# Patient Record
Sex: Female | Born: 1965 | Race: White | Hispanic: No | Smoking: Former smoker
Health system: Southern US, Community
[De-identification: ages and names within clinical notes are randomized; demographics above are authoritative.]

## PROBLEM LIST (undated history)

## (undated) DIAGNOSIS — K449 Diaphragmatic hernia without obstruction or gangrene: Secondary | ICD-10-CM

## (undated) DIAGNOSIS — N3010 Interstitial cystitis (chronic) without hematuria: Secondary | ICD-10-CM

## (undated) HISTORY — DX: Interstitial cystitis (chronic) without hematuria: N30.10

## (undated) HISTORY — PX: REDUCTION MAMMAPLASTY: SUR839

## (undated) HISTORY — DX: Diaphragmatic hernia without obstruction or gangrene: K44.9

---

## 1997-10-10 ENCOUNTER — Other Ambulatory Visit: Admission: RE | Admit: 1997-10-10 | Discharge: 1997-10-10 | Payer: Self-pay | Admitting: Obstetrics and Gynecology

## 2009-04-05 ENCOUNTER — Ambulatory Visit: Payer: Self-pay | Admitting: Family Medicine

## 2009-04-05 DIAGNOSIS — J209 Acute bronchitis, unspecified: Secondary | ICD-10-CM | POA: Insufficient documentation

## 2009-04-10 ENCOUNTER — Encounter: Payer: Self-pay | Admitting: Family Medicine

## 2009-04-15 ENCOUNTER — Telehealth (INDEPENDENT_AMBULATORY_CARE_PROVIDER_SITE_OTHER): Payer: Self-pay | Admitting: *Deleted

## 2010-02-12 NOTE — Progress Notes (Signed)
  Phone Note Outgoing Call Call back at [facility] Phone [phone]   Call placed by: Lajean Saver RN,  April 15, 2009  12:00 PM Call placed to: Patient Action Taken: Phone Call Completed Reason for Call: Discuss lab or test results Details for Reason: elevated pertusis panel Summary of Call: I spoke with Molly Boone regarding her bordetella pertusis lab results. I informed her Dr. 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 I told her I checked with Dr. Cathren Harsh, who is here today, reguarding treatment and he said the Z-pak she was given should treat the pertusis and if she is not imporoving to come back in to be seen by a doctor and given a new antibiotic Rx. She stated she was still coughing but it had much imporved and other symptoms had resolved. Initial call taken by: Lajean Saver RN,  April 15, 2009 4:02 PM

## 2010-02-12 NOTE — Assessment & Plan Note (Signed)
Summary: COUGH/TJ rm 3   Vital Signs:  Patient Profile:   45 Years Old Female CC:      Cold & URI symptoms Height:     66 inches Weight:      170 pounds O2 Sat:      100 % O2 treatment:    Room Air Temp:     98.7 degrees F oral Pulse rate:   107 / minute Pulse rhythm:   regular Resp:     18 per minute BP sitting:   122 / 86  (right arm) Cuff size:   regular  Vitals Entered By: Areta Haber CMA (April 05, 2009 10:02 AM)                  Prior Medication List:  No prior medications documented  Current Allergies (reviewed today): ! AUGMENTIN ! SULFA   History of Present Illness Chief Complaint: Cold & URI symptoms History of Present Illness: Patient has been coughing for 2 weeks it has been a productive cough and now she has been plagued by persistant unreleenting cough. She was DX w/pneumonia but no CXR was done. She states she was only given 2oz of tussionex, and doxycycline. She has run out of the tussionex and is still coughing wildly.   Current Problems: BRONCHITIS, ACUTE (ICD-466.0) COUGH (ICD-786.2)   Current Meds WELLBUTRIN XL 150 MG XR24H-TAB (BUPROPION HCL) 1 tab by mouth once daily NEXIUM 40 MG CPDR (ESOMEPRAZOLE MAGNESIUM) 1 tab by mouth once daily DOXYCYCLINE HYCLATE 100 MG SOLR (DOXYCYCLINE HYCLATE) 2 tabs by mouth once daily TUSSIONEX PENNKINETIC ER 8-10 MG/5ML LQCR (CHLORPHENIRAMINE-HYDROCODONE) as directed ZITHROMAX Z-PAK 250 MG  TABS (AZITHROMYCIN) Use as directed TUSSIONEX PENNKINETIC ER 8-10 MG/5ML LQCR (CHLORPHENIRAMINE-HYDROCODONE) sig 1 tsp by mouth twice a day as needed for cough  REVIEW OF SYSTEMS Constitutional Symptoms      Denies fever, chills, night sweats, weight loss, weight gain, and fatigue.  Eyes       Denies change in vision, eye pain, eye discharge, glasses, contact lenses, and eye surgery. Ear/Nose/Throat/Mouth       Complains of frequent runny nose, sinus problems, and hoarseness.      Denies hearing loss/aids, change in  hearing, ear pain, ear discharge, dizziness, frequent nose bleeds, sore throat, and tooth pain or bleeding.      Comments: green Respiratory       Complains of dry cough and productive cough.      Denies wheezing, shortness of breath, asthma, bronchitis, and emphysema/COPD.      Comments: chest congestion Cardiovascular       Denies murmurs, chest pain, and tires easily with exhertion.    Gastrointestinal       Denies stomach pain, nausea/vomiting, diarrhea, constipation, blood in bowel movements, and indigestion. Genitourniary       Denies painful urination, kidney stones, and loss of urinary control. Neurological       Complains of headaches.      Denies paralysis, seizures, and fainting/blackouts. Musculoskeletal       Denies muscle pain, joint pain, joint stiffness, decreased range of motion, redness, swelling, muscle weakness, and gout.  Skin       Denies bruising, unusual mles/lumps or sores, and hair/skin or nail changes.  Psych       Denies mood changes, temper/anger issues, anxiety/stress, speech problems, depression, and sleep problems. Other Comments: greenish-brown. Pt states she was Dx Pnuemonia Monday 03/31/09 and prescribed Doxycycline and Tussionex. Pt is out of cough medication.  Past History:  Social History: Last updated: 04/05/2009 Smokes Alcohol use-yes - occ - wknds Drug use-no  Risk Factors: Smoking Status: never (04/05/2009)  Social History: Reviewed history and no changes required. Smokes Alcohol use-yes - occ - wknds Drug use-no Smoking Status:  never Drug Use:  no Physical Exam General appearance: well developed, well nourished,  mild  distress Head: normocephalic, atraumatic Neck: neck supple,  trachea midline, no masses Chest/Lungs: no rales, wheezes, or rhonchi bilateral,patient actively coughing Heart: regular rate and  rhythm, no murmur Neurological: grossly intact and non-focal Skin: no obvious rashes or lesions MSE: oriented to time,  place, and person Assessment New Problems: BRONCHITIS, ACUTE (ICD-466.0) COUGH (ICD-786.2)  bronchitis cough  Patient Education: Patient and/or caregiver instructed in the following: rest fluids and Tylenol, quit smoking.  Plan New Medications/Changes: Sandria Senter ER 8-10 MG/5ML LQCR (CHLORPHENIRAMINE-HYDROCODONE) sig 1 tsp by mouth twice a day as needed for cough  #64floz x 0, 04/05/2009, Hassan Rowan MD ZITHROMAX Z-PAK 250 MG  TABS (AZITHROMYCIN) Use as directed  #1 x 0, 04/05/2009, Hassan Rowan MD  New Orders: T-Chest x-ray, 2 views [71020] Solumedrol up to 125mg  [J2930] Admin of Therapeutic Inj  intramuscular or subcutaneous [96372] New Patient Level III [04540] T- * Misc. Laboratory test [phone] Follow Up: Follow up in 2-3 days if no improvement, Follow up on an as needed basis, Follow up with Primary Physician  The patient and/or caregiver has been counseled thoroughly with regard to medications prescribed including dosage, schedule, interactions, rationale for use, and possible side effects and they verbalize understanding.  Diagnoses and expected course of recovery discussed and will return if not improved as expected or if the condition worsens. Patient and/or caregiver verbalized understanding.  Prescriptions: TUSSIONEX PENNKINETIC ER 8-10 MG/5ML LQCR (CHLORPHENIRAMINE-HYDROCODONE) sig 1 tsp by mouth twice a day as needed for cough  #22floz x 0   Entered and Authorized by:   Hassan Rowan MD   Signed by:   Hassan Rowan MD on 04/05/2009   Method used:   Print then Give to Patient   RxID:   [id number] ZITHROMAX Z-PAK 250 MG  TABS (AZITHROMYCIN) Use as directed  #1 x 0   Entered and Authorized by:   Hassan Rowan MD   Signed by:   Hassan Rowan MD on 04/05/2009   Method used:   Print then Give to Patient   RxID:   [id number]   Patient Instructions: 1)  Please schedule a follow-up appointment as needed in 3-5 days. 2)  Please schedule an appointment with your  primary doctor in 3-5 days. 3)  Tobacco is very bad for your health and your loved ones! You Should stop smoking!. 4)  Stop Smoking Tips: Choose a Quit date. Cut down before the Quit date. decide what you will do as a substitute when you feel the urge to smoke(gum,toothpick,exercise). 5)  Take your antibiotic as prescribed until ALL of it is gone, but stop if you develop a rash or swelling and contact our office as soon as possible. 6)  Acute bronchitis symptoms for less than 10 days are not helped by antibiotics. take over the counter cough medications. call if no improvment in  5-7 days, sooner if increasing cough, fever, or new symptoms( shortness of breath, chest pain). 7)  Medically cleared to return to work; return to work note provided.  Preventive Screening-Counseling & Management  Alcohol-Tobacco     Smoking Status: never     Smoking Cessation Counseling: yes  Drug Use:  no.      Medication Administration  Injection # 1:    Medication: Solumedrol up to 125mg     Diagnosis: BRONCHITIS, ACUTE (ICD-466.0)    Route: IM    Site: RUOQ gluteus    Exp Date: 09/31/2013    Lot #: [id number]    Mfr: Pharmacia    Comments: Administerd 125 mg    Patient tolerated injection without complications    Given by: Areta Haber CMA (April 05, 2009 10:57 AM)  Orders Added: 1)  T-Chest x-ray, 2 views [71020] 2)  Solumedrol up to 125mg  [J2930] 3)  Admin of Therapeutic Inj  intramuscular or subcutaneous [96372] 4)  New Patient Level III [99203] 5)  T- * Misc. Laboratory test [phone]

## 2011-03-21 IMAGING — CR DG CHEST 2V
3 series · 3 of 3 positions shown · non-contrast
Comparison: None.

CLINICAL DATA: Cough and chest tightness.

CHEST - 2 VIEW

[view not recorded (1 of 3)]
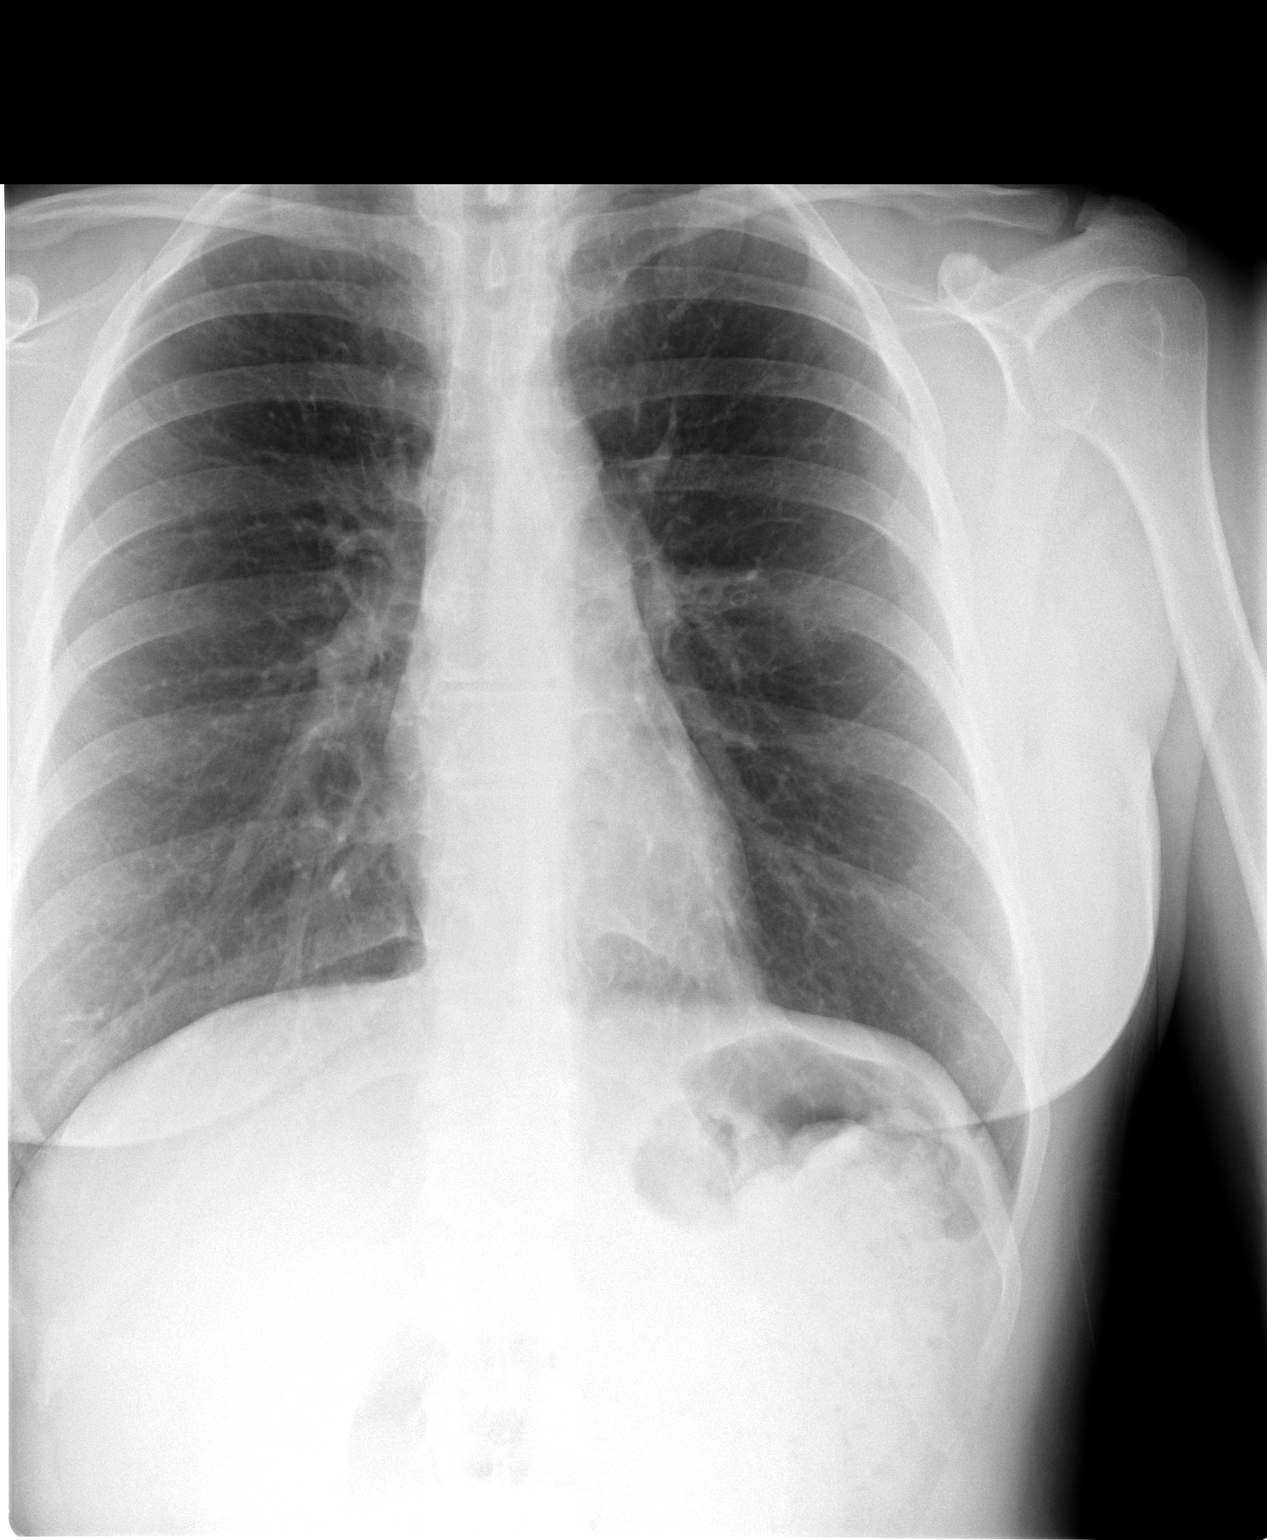

[view not recorded (2 of 3)]
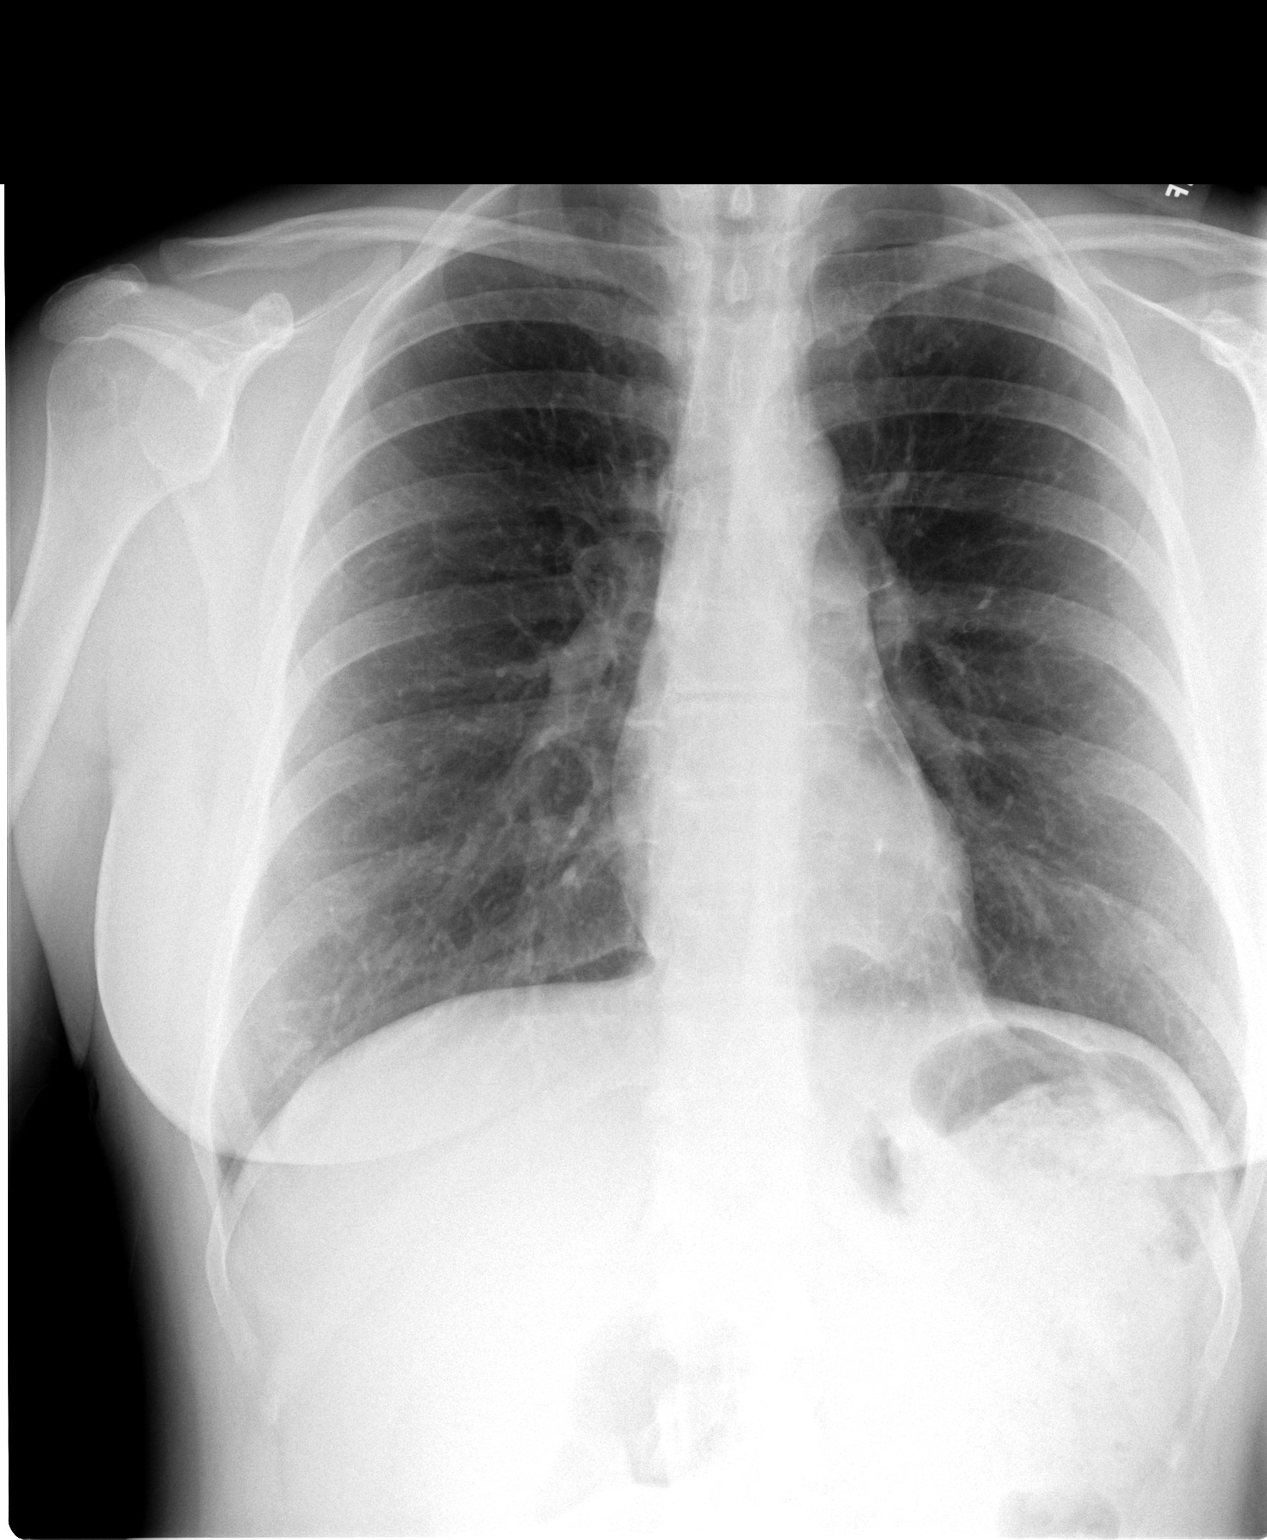

[view not recorded (3 of 3)]
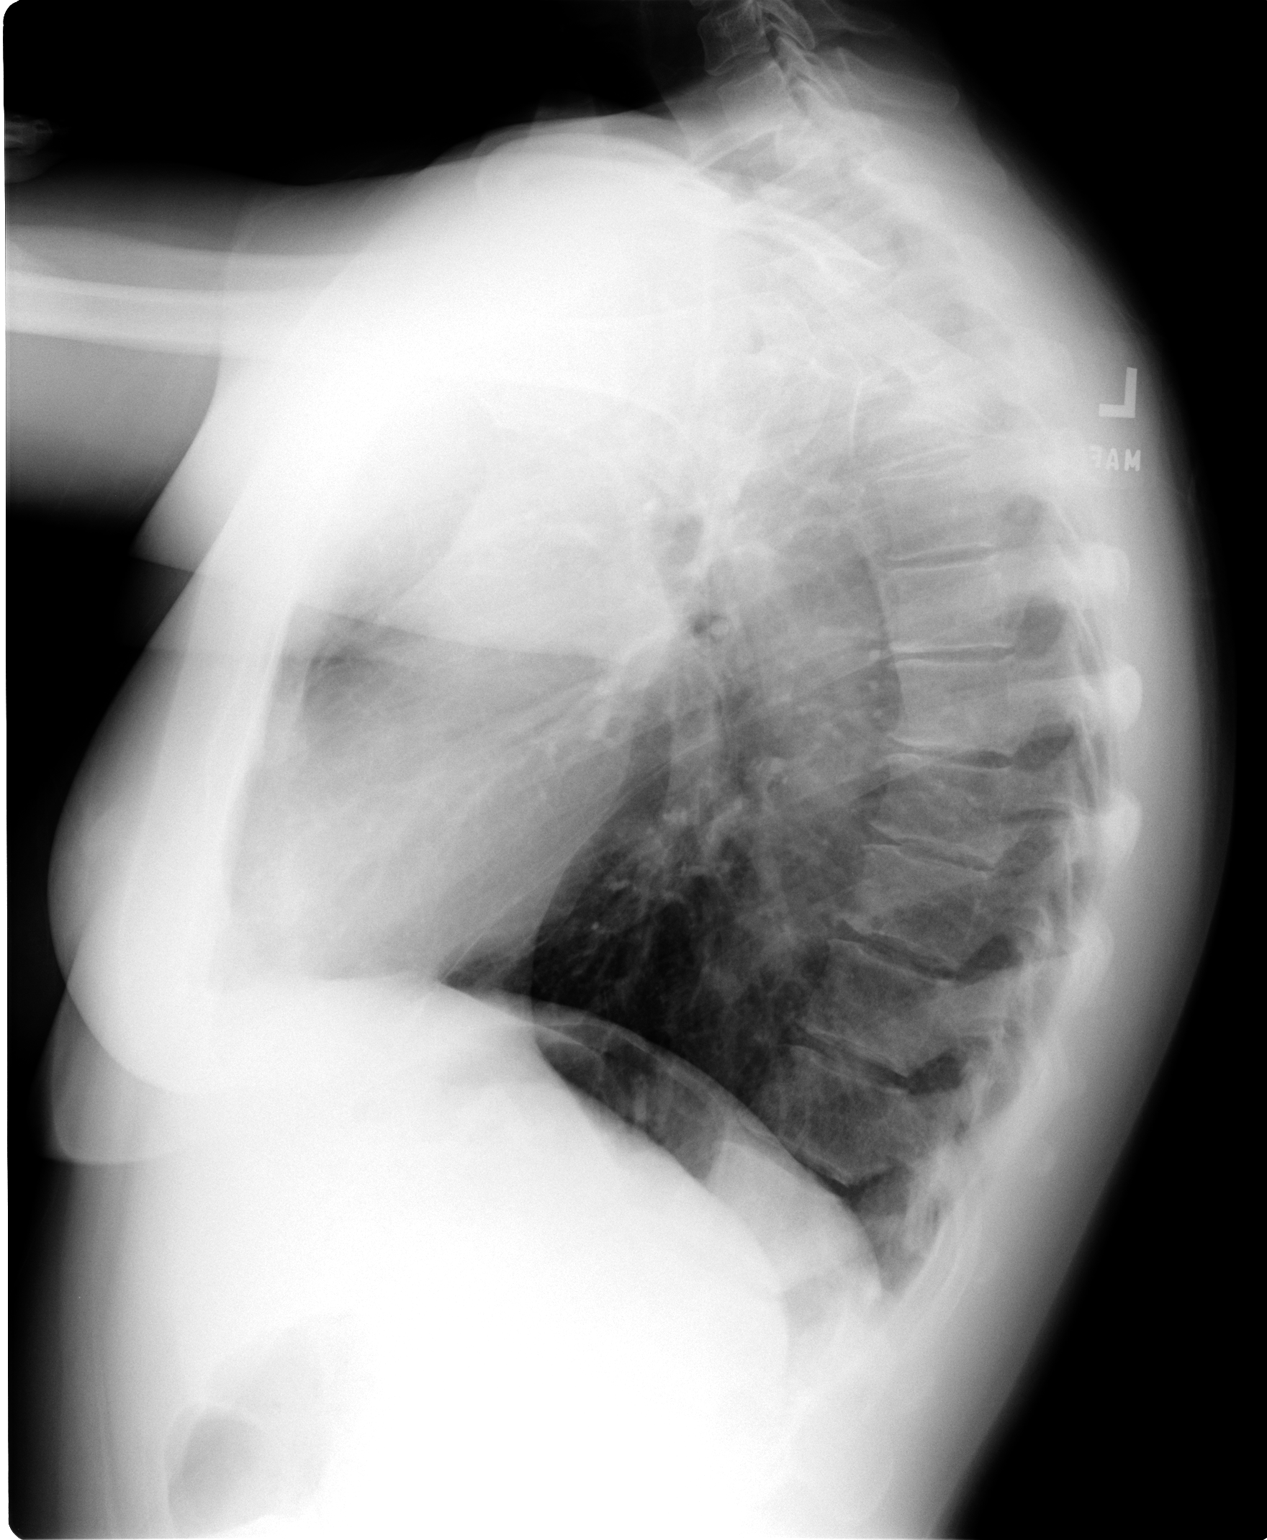

[3 of 3 positions shown; findings below may reference images not displayed]

FINDINGS: Trachea is midline.  Heart size normal.  Lungs are clear.
No pleural fluid.
IMPRESSION: No acute findings.

## 2016-08-30 ENCOUNTER — Ambulatory Visit: Payer: 59 | Admitting: Physical Therapy

## 2017-10-01 ENCOUNTER — Encounter: Payer: Self-pay | Admitting: *Deleted

## 2017-10-01 DIAGNOSIS — F419 Anxiety disorder, unspecified: Secondary | ICD-10-CM

## 2017-10-01 DIAGNOSIS — F902 Attention-deficit hyperactivity disorder, combined type: Secondary | ICD-10-CM

## 2017-10-01 DIAGNOSIS — F39 Unspecified mood [affective] disorder: Secondary | ICD-10-CM

## 2017-10-18 ENCOUNTER — Encounter: Payer: Self-pay | Admitting: Psychiatry

## 2017-10-18 ENCOUNTER — Ambulatory Visit (INDEPENDENT_AMBULATORY_CARE_PROVIDER_SITE_OTHER): Payer: BLUE CROSS/BLUE SHIELD | Admitting: Psychiatry

## 2017-10-18 VITALS — BP 116/80 | HR 77 | Ht 65.0 in

## 2017-10-18 DIAGNOSIS — F900 Attention-deficit hyperactivity disorder, predominantly inattentive type: Secondary | ICD-10-CM | POA: Diagnosis not present

## 2017-10-18 DIAGNOSIS — F3342 Major depressive disorder, recurrent, in full remission: Secondary | ICD-10-CM

## 2017-10-18 MED ORDER — VORTIOXETINE HBR 10 MG PO TABS: 10.0000 mg | ORAL_TABLET | Freq: Every day | ORAL | 5 refills | Status: DC

## 2017-10-18 MED ORDER — AMPHETAMINE-DEXTROAMPHETAMINE 30 MG PO TABS: 30.0000 mg | ORAL_TABLET | Freq: Every day | ORAL | 0 refills | Status: DC

## 2017-10-18 NOTE — Progress Notes (Signed)
Molly Boone [id number] [birth year] 52 y.o.  Subjective:   Patient ID:  Molly Boone is a 52 y.o. (DOB [birth year]) female.  Chief Complaint: No chief complaint on file.   HPI Molly Boone presents to the office today for follow-up of depression and ADD. She reports that she takes Trintellix in the evening because she would have n/v when she took Trintellix in the morning. Reports that a certain manufacturer of Adderall is more effective and she has not been able to find it in stock at any pharmacy. She reports that she is having to take three 10 mg tabs in the morning to adequately control s/s. Reports that work is going well and supervisors are praising her for her work. "Everything is going well. It couldn't be better." Denies depressed mood- "I'm pretty content." Denies anxiety. Reports that her energy and motivation is better compared to the past and at the end of the day is being more active and doing projects she enjoys. Sleeping well- "I've never slept so well." Appetite has been good. Denies difficulty with concentration or focus. She reports that she has been able to multi-task and enjoys this. Denies SI.   Expecting first grandchild in 2 weeks. Dog is now living with her daughter and this is working out well.   Medications: I have reviewed the patient's current medications.  Allergies:  Allergies  Allergen Reactions  . Amoxicillin-Pot Clavulanate     REACTION: GI Upset  . Effexor [Venlafaxine]   . Macrodantin [Nitrofurantoin Macrocrystal]   . Sulfonamide Derivatives     REACTION: GI Upset    Past Medical History:  Diagnosis Date  . Hiatal hernia   . Interstitial cystitis     History reviewed. No pertinent surgical history.  Family History  Problem Relation Age of Onset  . Depression Mother     Social History   Socioeconomic History  . Marital status: Married    Spouse name: Not on file  . Number of children: Not on file  . Years of education: Not on file  .  Highest education level: Not on file  Occupational History  . Not on file  Social Needs  . Financial resource strain: Not on file  . Food insecurity:    Worry: Not on file    Inability: Not on file  . Transportation needs:    Medical: Not on file    Non-medical: Not on file  Tobacco Use  . Smoking status: Former Games developer  . Smokeless tobacco: Never Used  Substance and Sexual Activity  . Alcohol use: Not on file  . Drug use: Not on file  . Sexual activity: Not on file  Lifestyle  . Physical activity:    Days per week: Not on file    Minutes per session: Not on file  . Stress: Not on file  Relationships  . Social connections:    Talks on phone: Not on file    Gets together: Not on file    Attends religious service: Not on file    Active member of club or organization: Not on file    Attends meetings of clubs or organizations: Not on file    Relationship status: Not on file  . Intimate partner violence:    Fear of current or ex partner: Not on file    Emotionally abused: Not on file    Physically abused: Not on file    Forced sexual activity: Not on file  Other Topics Concern  .  Not on file  Social History Narrative  . Not on file    Past Medical History, Surgical history, Social history, and Family history were reviewed and updated as appropriate.   Please see review of systems for further details on the patient's review from today.   Review of Systems:  Review of Systems  Constitutional: Negative.   Gastrointestinal: Positive for constipation.  Genitourinary: Negative.     Objective:   Physical Exam:  BP 116/80   Pulse 77   Ht 5\' 5"  (1.651 m)   BMI 28.29 kg/m   Physical Exam  Constitutional: She is oriented to person, place, and time. She appears well-developed. No distress.  Musculoskeletal: Normal range of motion.  Neurological: She is alert and oriented to person, place, and time. Coordination normal.  Psychiatric: Her speech is normal and behavior is  normal. Judgment and thought content normal. Her mood appears not anxious. Her affect is not angry, not blunt, not labile and not inappropriate. Cognition and memory are normal. She does not exhibit a depressed mood. She expresses no homicidal and no suicidal ideation. She expresses no suicidal plans and no homicidal plans.    Lab Review:  No results found for: NA, K, CL, CO2, GLUCOSE, BUN, CREATININE, CALCIUM, PROT, ALBUMIN, AST, ALT, ALKPHOS, BILITOT, GFRNONAA, GFRAA  No results found for: WBC, RBC, HGB, HCT, PLT, MCV, MCH, MCHC, RDW, LYMPHSABS, MONOABS, EOSABS, BASOSABS   Assessment: Plan:   Patient seen for 30 minutes and greater than 50% of session spent counseling patient to include discussing alternatives to Adderall since she reports having difficulty obtaining Adderall from manufacturer that has been best tolerated for her. Patient reports that she would prefer not to make any changes at this time since overall her signs and symptoms are very well controlled.  Discussed changing Adderall to 30 mg tablets since patient reports that she has recently been taking three Adderall 10 mg tablets in the morning.  Patient agrees with this plan. Attention deficit hyperactivity disorder (ADHD), predominantly inattentive type - Plan: amphetamine-dextroamphetamine (ADDERALL) 30 MG tablet, amphetamine-dextroamphetamine (ADDERALL) 30 MG tablet, amphetamine-dextroamphetamine (ADDERALL) 30 MG tablet  Recurrent major depressive disorder, in full remission (HCC) - Plan: vortioxetine HBr (TRINTELLIX) 10 MG TABS tablet  Please see After Visit Summary for patient specific instructions.  Future Appointments  Date Time Provider Department Center  01/20/2018  1:30 PM Corie Chiquito, PMHNP [facility] None    No orders of the defined types were placed in this encounter.     -------------------------------

## 2018-01-01 ENCOUNTER — Encounter: Payer: Self-pay | Admitting: Emergency Medicine

## 2018-01-01 DIAGNOSIS — F419 Anxiety disorder, unspecified: Secondary | ICD-10-CM | POA: Insufficient documentation

## 2018-01-20 ENCOUNTER — Encounter: Payer: Self-pay | Admitting: Psychiatry

## 2018-01-20 ENCOUNTER — Ambulatory Visit (INDEPENDENT_AMBULATORY_CARE_PROVIDER_SITE_OTHER): Payer: BLUE CROSS/BLUE SHIELD | Admitting: Psychiatry

## 2018-01-20 DIAGNOSIS — F330 Major depressive disorder, recurrent, mild: Secondary | ICD-10-CM

## 2018-01-20 DIAGNOSIS — F900 Attention-deficit hyperactivity disorder, predominantly inattentive type: Secondary | ICD-10-CM

## 2018-01-20 MED ORDER — AMPHETAMINE-DEXTROAMPHETAMINE 20 MG PO TABS: ORAL_TABLET | ORAL | 0 refills | Status: DC

## 2018-01-20 MED ORDER — VORTIOXETINE HBR 20 MG PO TABS: 20.0000 mg | ORAL_TABLET | Freq: Every day | ORAL | 5 refills | Status: DC

## 2018-01-20 NOTE — Progress Notes (Signed)
Molly Boone [id number] 1965/10/03 53 y.o.  Subjective:   Patient ID:  Molly Boone is a 53 y.o. (DOB [birth year]) female.  Chief Complaint: No chief complaint on file.   HPI Molly Boone presents to the office today for follow-up of ADD. Has been enjoying new grand baby on November 8th. She reports that she has been working frequently. She reports that she has been getting recognized and rewarded for her work. She reports that she has been grinding her teeth since increase in Adderall dose to 30 mg po Q am. She reports that she is sleeping well. Reports that appetite has been stable. Denies anxiety. Mood is not depressed. She reports motivation is low on the weekends and tends to stay home all weekend and go home immediately after work. Energy is somewhat low. Concentration has been adequate. Denies anhedonia. Denies SI.   Review of Systems:  Review of Systems  HENT: Positive for congestion.        Cold s/s started yesterday.  Gastrointestinal: Positive for constipation.       Reflux  Musculoskeletal: Negative for gait problem.  Neurological: Negative for tremors.  Psychiatric/Behavioral:       Please refer to HPI    Medications: I have reviewed the patient's current medications.  Current Outpatient Medications  Medication Sig Dispense Refill  . amphetamine-dextroamphetamine (ADDERALL) 20 MG tablet Take 1 tab po q am and 1/2 tab po q afternoon 45 tablet 0  . [START ON 02/17/2018] amphetamine-dextroamphetamine (ADDERALL) 20 MG tablet Take 1 tab po q am and 1/2 tab po q afternoon 45 tablet 0  . [START ON 03/17/2018] amphetamine-dextroamphetamine (ADDERALL) 20 MG tablet Take 1 tablet po q am and 1/2 tab po q afternoon 45 tablet 0  . lansoprazole (PREVACID) 30 MG capsule Take 30 mg by mouth 2 (two) times daily.    . phenazopyridine (PYRIDIUM) 100 MG tablet Take 100 mg by mouth 3 (three) times daily as needed for pain.    Marland Kitchen topiramate (TOPAMAX) 100 MG tablet Take 100 mg by mouth daily.      Marland Kitchen vortioxetine HBr (TRINTELLIX) 20 MG TABS tablet Take 1 tablet (20 mg total) by mouth daily for 30 days. 30 tablet 5   No current facility-administered medications for this visit.     Medication Side Effects: Other: Teeth grinding  Allergies:  Allergies  Allergen Reactions  . Amoxicillin-Pot Clavulanate     REACTION: GI Upset  . Effexor [Venlafaxine]   . Macrodantin [Nitrofurantoin Macrocrystal]   . Sulfonamide Derivatives     REACTION: GI Upset    Past Medical History:  Diagnosis Date  . Hiatal hernia   . Interstitial cystitis     Family History  Problem Relation Age of Onset  . Depression Mother     Social History   Socioeconomic History  . Marital status: Married    Spouse name: Not on file  . Number of children: Not on file  . Years of education: Not on file  . Highest education level: Not on file  Occupational History  . Not on file  Social Needs  . Financial resource strain: Not on file  . Food insecurity:    Worry: Not on file    Inability: Not on file  . Transportation needs:    Medical: Not on file    Non-medical: Not on file  Tobacco Use  . Smoking status: Former Games developer  . Smokeless tobacco: Never Used  Substance and Sexual Activity  . Alcohol  use: Not on file  . Drug use: Not on file  . Sexual activity: Not on file  Lifestyle  . Physical activity:    Days per week: Not on file    Minutes per session: Not on file  . Stress: Not on file  Relationships  . Social connections:    Talks on phone: Not on file    Gets together: Not on file    Attends religious service: Not on file    Active member of club or organization: Not on file    Attends meetings of clubs or organizations: Not on file    Relationship status: Not on file  . Intimate partner violence:    Fear of current or ex partner: Not on file    Emotionally abused: Not on file    Physically abused: Not on file    Forced sexual activity: Not on file  Other Topics Concern  . Not on  file  Social History Narrative  . Not on file    Past Medical History, Surgical history, Social history, and Family history were reviewed and updated as appropriate.   Please see review of systems for further details on the patient's review from today.   Objective:   Physical Exam:  BP 119/81   Pulse 95   Physical Exam Constitutional:      General: She is not in acute distress.    Appearance: She is well-developed.  Musculoskeletal:        General: No deformity.  Neurological:     Mental Status: She is alert and oriented to person, place, and time.     Coordination: Coordination normal.  Psychiatric:        Mood and Affect: Mood is not anxious or depressed. Affect is not labile, blunt, angry or inappropriate.        Speech: Speech normal.        Behavior: Behavior normal.        Thought Content: Thought content normal. Thought content does not include homicidal or suicidal ideation. Thought content does not include homicidal or suicidal plan.        Judgment: Judgment normal.     Comments: Insight intact. No auditory or visual hallucinations. No delusions.      Lab Review:  No results found for: NA, K, CL, CO2, GLUCOSE, BUN, CREATININE, CALCIUM, PROT, ALBUMIN, AST, ALT, ALKPHOS, BILITOT, GFRNONAA, GFRAA  No results found for: WBC, RBC, HGB, HCT, PLT, MCV, MCH, MCHC, RDW, LYMPHSABS, MONOABS, EOSABS, BASOSABS  No results found for: POCLITH, LITHIUM   No results found for: PHENYTOIN, PHENOBARB, VALPROATE, CBMZ   .res Assessment: Plan:   Will increase Trintellix to 20 mg daily to improve mild depressive signs and symptoms. Will change Adderall to 20 mg 1 tab p.o. every morning and one half tab p.o. q. afternoon since patient has noticed teeth grinding and clenching since Adderall was changed to 30 mg p.o. every morning.  Will request that pharmacy not dispense Adderall from [facility] manufacturer since patient reports that this generic causes nausea. Attention deficit  hyperactivity disorder (ADHD), predominantly inattentive type - Chronic, stable - Plan: amphetamine-dextroamphetamine (ADDERALL) 20 MG tablet, amphetamine-dextroamphetamine (ADDERALL) 20 MG tablet, amphetamine-dextroamphetamine (ADDERALL) 20 MG tablet  Mild episode of recurrent major depressive disorder (HCC) - mild depressive s/s to include low energy and low motivation. - Plan: vortioxetine HBr (TRINTELLIX) 20 MG TABS tablet  Please see After Visit Summary for patient specific instructions.  Future Appointments  Date Time Provider Department Center  04/20/2018 11:00 AM Corie Chiquitoarter, Milina Pagett, PMHNP [facility] None    No orders of the defined types were placed in this encounter.     -------------------------------

## 2018-02-27 DIAGNOSIS — K219 Gastro-esophageal reflux disease without esophagitis: Secondary | ICD-10-CM | POA: Diagnosis not present

## 2018-02-27 DIAGNOSIS — R112 Nausea with vomiting, unspecified: Secondary | ICD-10-CM | POA: Diagnosis not present

## 2018-02-27 DIAGNOSIS — K5904 Chronic idiopathic constipation: Secondary | ICD-10-CM | POA: Diagnosis not present

## 2018-03-21 DIAGNOSIS — K5900 Constipation, unspecified: Secondary | ICD-10-CM | POA: Diagnosis not present

## 2018-03-21 DIAGNOSIS — R109 Unspecified abdominal pain: Secondary | ICD-10-CM | POA: Diagnosis not present

## 2018-03-27 DIAGNOSIS — K229 Disease of esophagus, unspecified: Secondary | ICD-10-CM | POA: Diagnosis not present

## 2018-03-27 DIAGNOSIS — K228 Other specified diseases of esophagus: Secondary | ICD-10-CM | POA: Diagnosis not present

## 2018-03-27 DIAGNOSIS — K219 Gastro-esophageal reflux disease without esophagitis: Secondary | ICD-10-CM | POA: Diagnosis not present

## 2018-04-20 ENCOUNTER — Telehealth: Payer: Self-pay | Admitting: Psychiatry

## 2018-04-20 ENCOUNTER — Other Ambulatory Visit: Payer: Self-pay

## 2018-04-20 ENCOUNTER — Ambulatory Visit: Payer: BLUE CROSS/BLUE SHIELD | Admitting: Psychiatry

## 2018-04-20 DIAGNOSIS — F900 Attention-deficit hyperactivity disorder, predominantly inattentive type: Secondary | ICD-10-CM

## 2018-04-20 MED ORDER — AMPHETAMINE-DEXTROAMPHETAMINE 20 MG PO TABS: ORAL_TABLET | ORAL | 0 refills | Status: DC

## 2018-04-20 NOTE — Telephone Encounter (Signed)
Submitted for approval 

## 2018-04-20 NOTE — Telephone Encounter (Signed)
Patient need refill on Adderall 30 mg. 1 1/2 a day send to pharmacy on file

## 2018-05-12 ENCOUNTER — Ambulatory Visit (INDEPENDENT_AMBULATORY_CARE_PROVIDER_SITE_OTHER): Payer: BLUE CROSS/BLUE SHIELD | Admitting: Psychiatry

## 2018-05-12 ENCOUNTER — Other Ambulatory Visit: Payer: Self-pay

## 2018-05-12 ENCOUNTER — Encounter: Payer: Self-pay | Admitting: Psychiatry

## 2018-05-12 DIAGNOSIS — F330 Major depressive disorder, recurrent, mild: Secondary | ICD-10-CM | POA: Diagnosis not present

## 2018-05-12 DIAGNOSIS — F900 Attention-deficit hyperactivity disorder, predominantly inattentive type: Secondary | ICD-10-CM | POA: Diagnosis not present

## 2018-05-12 MED ORDER — AMPHETAMINE-DEXTROAMPHETAMINE 20 MG PO TABS: ORAL_TABLET | ORAL | 0 refills | Status: DC

## 2018-05-12 MED ORDER — VORTIOXETINE HBR 20 MG PO TABS: 20.0000 mg | ORAL_TABLET | Freq: Every day | ORAL | 5 refills | Status: DC

## 2018-05-12 NOTE — Progress Notes (Signed)
Molly Boone [id number] [birth year] 53 y.o.  Virtual Visit via Telephone Note  I connected with@ on 05/12/18 at 10:00 AM EDT by telephone and verified that I am speaking with the correct person using two identifiers.   I discussed the limitations, risks, security and privacy concerns of performing an evaluation and management service by telephone and the availability of in person appointments. I also discussed with the patient that there may be a patient responsible charge related to this service. The patient expressed understanding and agreed to proceed.   I discussed the assessment and treatment plan with the patient. The patient was provided an opportunity to ask questions and all were answered. The patient agreed with the plan and demonstrated an understanding of the instructions.   The patient was advised to call back or seek an in-person evaluation if the symptoms worsen or if the condition fails to improve as anticipated.  I provided 30 minutes of non-face-to-face time during this encounter.  The patient was located at home.  The provider was located at home.   Corie Chiquito, PMHNP   Subjective:   Patient ID:  Molly Boone is a 53 y.o. (DOB [birth year]) female.  Chief Complaint:  Chief Complaint  Patient presents with  . ADD  . Follow-up    h/o anxiety and depression    HPI Molly Boone presents for follow-up of ADD and h/o anxiety and depression. She reports that her work has been busier than usual and has been working from home since mid-March. Denies anxiety. Reports that she has been doing some projects at home and cleaning some things out. Reports that she occ feels "completely overwhelmed." She reports that her energy and motivation has been good. Describes adequate concentration and being productive. She reports that she is sleeping well. She reports that her appetite has been good and has been gaining weight. She reports that she has been cooking more. Denies SI.    She reports that she is going every weekend to see her daughter and granddaughter. Reports that her daughter has been asking her to help with childcare.    Review of Systems:  Review of Systems  Gastrointestinal: Positive for constipation and diarrhea.  Musculoskeletal: Negative for gait problem.  Neurological: Negative for tremors and headaches.  Psychiatric/Behavioral:       Please refer to HPI    Medications: I have reviewed the patient's current medications.  Current Outpatient Medications  Medication Sig Dispense Refill  . [START ON 07/15/2018] amphetamine-dextroamphetamine (ADDERALL) 20 MG tablet Take 1 tab po q am and 1/2 tab po q afternoon 45 tablet 0  . [START ON 06/17/2018] amphetamine-dextroamphetamine (ADDERALL) 20 MG tablet Take 1 tablet po q am and 1/2 tab po q afternoon 45 tablet 0  . [START ON 05/20/2018] amphetamine-dextroamphetamine (ADDERALL) 20 MG tablet Take 1 tab po q am and 1/2 tab po q afternoon 45 tablet 0  . lansoprazole (PREVACID) 30 MG capsule Take 30 mg by mouth 2 (two) times daily.    . phenazopyridine (PYRIDIUM) 100 MG tablet Take 100 mg by mouth 3 (three) times daily as needed for pain.    Marland Kitchen vortioxetine HBr (TRINTELLIX) 20 MG TABS tablet Take 1 tablet (20 mg total) by mouth daily for 30 days. 30 tablet 5   No current facility-administered medications for this visit.     Medication Side Effects: Other: Possible constipation  Allergies:  Allergies  Allergen Reactions  . Amoxicillin-Pot Clavulanate     REACTION: GI Upset  .  Effexor [Venlafaxine]   . Macrodantin [Nitrofurantoin Macrocrystal]   . Sulfonamide Derivatives     REACTION: GI Upset    Past Medical History:  Diagnosis Date  . Hiatal hernia   . Interstitial cystitis     Family History  Problem Relation Age of Onset  . Depression Mother     Social History   Socioeconomic History  . Marital status: Married    Spouse name: Not on file  . Number of children: Not on file  . Years  of education: Not on file  . Highest education level: Not on file  Occupational History  . Not on file  Social Needs  . Financial resource strain: Not on file  . Food insecurity:    Worry: Not on file    Inability: Not on file  . Transportation needs:    Medical: Not on file    Non-medical: Not on file  Tobacco Use  . Smoking status: Former Games developer  . Smokeless tobacco: Never Used  Substance and Sexual Activity  . Alcohol use: Not on file  . Drug use: Not on file  . Sexual activity: Not on file  Lifestyle  . Physical activity:    Days per week: Not on file    Minutes per session: Not on file  . Stress: Not on file  Relationships  . Social connections:    Talks on phone: Not on file    Gets together: Not on file    Attends religious service: Not on file    Active member of club or organization: Not on file    Attends meetings of clubs or organizations: Not on file    Relationship status: Not on file  . Intimate partner violence:    Fear of current or ex partner: Not on file    Emotionally abused: Not on file    Physically abused: Not on file    Forced sexual activity: Not on file  Other Topics Concern  . Not on file  Social History Narrative  . Not on file    Past Medical History, Surgical history, Social history, and Family history were reviewed and updated as appropriate.   Please see review of systems for further details on the patient's review from today.   Objective:   Physical Exam:  There were no vitals taken for this visit.  Physical Exam Neurological:     Mental Status: She is alert and oriented to person, place, and time.     Cranial Nerves: No dysarthria.  Psychiatric:        Attention and Perception: Attention normal.        Mood and Affect: Mood normal.        Speech: Speech normal.        Behavior: Behavior is cooperative.        Thought Content: Thought content normal. Thought content is not paranoid or delusional. Thought content does not  include homicidal or suicidal ideation. Thought content does not include homicidal or suicidal plan.        Cognition and Memory: Cognition and memory normal.        Judgment: Judgment normal.     Lab Review:  No results found for: NA, K, CL, CO2, GLUCOSE, BUN, CREATININE, CALCIUM, PROT, ALBUMIN, AST, ALT, ALKPHOS, BILITOT, GFRNONAA, GFRAA  No results found for: WBC, RBC, HGB, HCT, PLT, MCV, MCH, MCHC, RDW, LYMPHSABS, MONOABS, EOSABS, BASOSABS  No results found for: POCLITH, LITHIUM   No results found for: PHENYTOIN, PHENOBARB,  VALPROATE, CBMZ   .res Assessment: Plan:   Continue Trintellix 20 mg daily for depression. Continue Adderall 20 mg p.o. every morning and 10 mg p.o. q. afternoon for attention deficit. Patient to follow-up in 3 months or sooner if clinically indicated.   Attention deficit hyperactivity disorder (ADHD), predominantly inattentive type - Chronic, stable - Plan: amphetamine-dextroamphetamine (ADDERALL) 20 MG tablet, amphetamine-dextroamphetamine (ADDERALL) 20 MG tablet, amphetamine-dextroamphetamine (ADDERALL) 20 MG tablet  Mild episode of recurrent major depressive disorder (HCC) - mild depressive s/s to include low energy and low motivation. - Plan: vortioxetine HBr (TRINTELLIX) 20 MG TABS tablet  Please see After Visit Summary for patient specific instructions.  No future appointments.  No orders of the defined types were placed in this encounter.     -------------------------------

## 2018-07-04 DIAGNOSIS — N6311 Unspecified lump in the right breast, upper outer quadrant: Secondary | ICD-10-CM | POA: Diagnosis not present

## 2018-07-07 DIAGNOSIS — M6289 Other specified disorders of muscle: Secondary | ICD-10-CM | POA: Diagnosis not present

## 2018-07-07 DIAGNOSIS — K5900 Constipation, unspecified: Secondary | ICD-10-CM | POA: Diagnosis not present

## 2018-07-07 DIAGNOSIS — R112 Nausea with vomiting, unspecified: Secondary | ICD-10-CM | POA: Diagnosis not present

## 2018-07-07 DIAGNOSIS — K5904 Chronic idiopathic constipation: Secondary | ICD-10-CM | POA: Diagnosis not present

## 2018-07-13 DIAGNOSIS — N6311 Unspecified lump in the right breast, upper outer quadrant: Secondary | ICD-10-CM | POA: Diagnosis not present

## 2018-07-13 DIAGNOSIS — R920 Mammographic microcalcification found on diagnostic imaging of breast: Secondary | ICD-10-CM | POA: Diagnosis not present

## 2018-07-13 DIAGNOSIS — N644 Mastodynia: Secondary | ICD-10-CM | POA: Diagnosis not present

## 2018-07-25 ENCOUNTER — Telehealth: Payer: Self-pay

## 2018-07-25 NOTE — Telephone Encounter (Signed)
Prior authorization approved for Trintellix 20 mg tablets, pt has new Airline pilot, approval effective 07/25/2018-07/23/2021

## 2018-09-05 ENCOUNTER — Encounter: Payer: Self-pay | Admitting: Psychiatry

## 2018-09-05 ENCOUNTER — Other Ambulatory Visit: Payer: Self-pay

## 2018-09-05 ENCOUNTER — Ambulatory Visit (INDEPENDENT_AMBULATORY_CARE_PROVIDER_SITE_OTHER): Payer: BLUE CROSS/BLUE SHIELD | Admitting: Psychiatry

## 2018-09-05 DIAGNOSIS — F900 Attention-deficit hyperactivity disorder, predominantly inattentive type: Secondary | ICD-10-CM

## 2018-09-05 MED ORDER — AMPHETAMINE-DEXTROAMPHETAMINE 20 MG PO TABS: ORAL_TABLET | ORAL | 0 refills | Status: DC

## 2018-09-05 NOTE — Progress Notes (Signed)
Molly Boone [id number] 09/07/1965 53 y.o.  Virtual Visit via Telephone Note  I connected with pt on 09/05/18 at  9:00 AM EDT by telephone and verified that I am speaking with the correct person using two identifiers.   I discussed the limitations, risks, security and privacy concerns of performing an evaluation and management service by telephone and the availability of in person appointments. I also discussed with the patient that there may be a patient responsible charge related to this service. The patient expressed understanding and agreed to proceed.   I discussed the assessment and treatment plan with the patient. The patient was provided an opportunity to ask questions and all were answered. The patient agreed with the plan and demonstrated an understanding of the instructions.   The patient was advised to call back or seek an in-person evaluation if the symptoms worsen or if the condition fails to improve as anticipated.  I provided 30 minutes of non-face-to-face time during this encounter.  The patient was located at home.  The provider was located at [facility].   Corie Chiquito, PMHNP   Subjective:   Patient ID:  Molly Boone is a 53 y.o. (DOB Jan 27, 1965) female.  Chief Complaint:  Chief Complaint  Patient presents with  . Follow-up    ADD, h/o Depression    HPI Molly Boone presents for follow-up of ADD. Continues to work remotely and will likely continue to work from home through then the year. She reports that she has adjusted well overall to working from home. She reports "my moods have been pretty good." She reports enjoying things more and "feeling happier with myself." She reports that she is enjoying her time alone. She reports that she would have anxiety in the past with major life changes and now experiences more excitement than anxiety with major transitions. She reports that she has been able to focus and sustain concentration with working from  home. She reports that she is able to work several hours without interruption. She reports that her appetite has been good. She reports that she has been sleeping well. She reports that energy and concentraion have been good. Denies SI.   Reports that she has not taken Trintellix over the last few weeks due to n/v. Reports that n/v has decreased and not completely resolved.   Plans to move in September and is excited about this. 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 Has been able to see daughter and granddaughter regularly.   Review of Systems:  Review of Systems  Cardiovascular: Negative for palpitations.  Gastrointestinal: Positive for constipation.       Reports chronic episodes of n/v. Reports that GI specialist has scheduled gastric emptying test to r/o gastroparesis  Musculoskeletal: Negative for gait problem.  Neurological: Negative for tremors.  Psychiatric/Behavioral:       Please refer to HPI   Found a lump in her breast and had to see PCP to get referral for a mammogram. Reports that she had mammogram and results were WNL.   Medications: I have reviewed the patient's current medications.  Current Outpatient Medications  Medication Sig Dispense Refill  . [START ON 10/31/2018] amphetamine-dextroamphetamine (ADDERALL) 20 MG tablet Take 1 tab po q am and 1/2 tab po q afternoon 45 tablet 0  . [START ON 10/03/2018] amphetamine-dextroamphetamine (ADDERALL) 20 MG tablet Take 1 tablet po q am and 1/2 tab po q afternoon 45 tablet 0  . amphetamine-dextroamphetamine (ADDERALL) 20 MG tablet Take 1 tab  po q am and 1/2 tab po q afternoon 45 tablet 0  . lansoprazole (PREVACID) 30 MG capsule Take 30 mg by mouth 2 (two) times daily.    . ondansetron (ZOFRAN-ODT) 4 MG disintegrating tablet Take one before breakfast and two at bedtime    . phenazopyridine (PYRIDIUM) 100 MG tablet Take 100 mg by mouth 3 (three) times daily as needed for pain.    Marland Kitchen vortioxetine HBr (TRINTELLIX) 20 MG TABS  tablet Take 1 tablet (20 mg total) by mouth daily for 30 days. 30 tablet 5   No current facility-administered medications for this visit.     Medication Side Effects: Nausea  Allergies:  Allergies  Allergen Reactions  . Amoxicillin-Pot Clavulanate     REACTION: GI Upset  . Effexor [Venlafaxine]   . Macrodantin [Nitrofurantoin Macrocrystal]   . Sulfonamide Derivatives     REACTION: GI Upset    Past Medical History:  Diagnosis Date  . Hiatal hernia   . Interstitial cystitis     Family History  Problem Relation Age of Onset  . Depression Mother     Social History   Socioeconomic History  . Marital status: Married    Spouse name: Not on file  . Number of children: Not on file  . Years of education: Not on file  . Highest education level: Not on file  Occupational History  . Not on file  Social Needs  . Financial resource strain: Not on file  . Food insecurity    Worry: Not on file    Inability: Not on file  . Transportation needs    Medical: Not on file    Non-medical: Not on file  Tobacco Use  . Smoking status: Former Research scientist (life sciences)  . Smokeless tobacco: Never Used  Substance and Sexual Activity  . Alcohol use: Not on file  . Drug use: Not on file  . Sexual activity: Not on file  Lifestyle  . Physical activity    Days per week: Not on file    Minutes per session: Not on file  . Stress: Not on file  Relationships  . Social Herbalist on phone: Not on file    Gets together: Not on file    Attends religious service: Not on file    Active member of club or organization: Not on file    Attends meetings of clubs or organizations: Not on file    Relationship status: Not on file  . Intimate partner violence    Fear of current or ex partner: Not on file    Emotionally abused: Not on file    Physically abused: Not on file    Forced sexual activity: Not on file  Other Topics Concern  . Not on file  Social History Narrative  . Not on file    Past  Medical History, Surgical history, Social history, and Family history were reviewed and updated as appropriate.   Please see review of systems for further details on the patient's review from today.   Objective:   Physical Exam:  Wt 160 lb (72.6 kg)   BMI 26.63 kg/m   Physical Exam Neurological:     Mental Status: She is alert and oriented to person, place, and time.     Cranial Nerves: No dysarthria.  Psychiatric:        Attention and Perception: Attention normal.        Mood and Affect: Mood normal.        Speech:  Speech normal.        Behavior: Behavior is cooperative.        Thought Content: Thought content normal. Thought content is not paranoid or delusional. Thought content does not include homicidal or suicidal ideation. Thought content does not include homicidal or suicidal plan.        Cognition and Memory: Cognition and memory normal.        Judgment: Judgment normal.     Lab Review:  No results found for: NA, K, CL, CO2, GLUCOSE, BUN, CREATININE, CALCIUM, PROT, ALBUMIN, AST, ALT, ALKPHOS, BILITOT, GFRNONAA, GFRAA  No results found for: WBC, RBC, HGB, HCT, PLT, MCV, MCH, MCHC, RDW, LYMPHSABS, MONOABS, EOSABS, BASOSABS  No results found for: POCLITH, LITHIUM   No results found for: PHENYTOIN, PHENOBARB, VALPROATE, CBMZ   .res Assessment: Plan:   Discussed that Trintellix has a long-half life and that it may be a couple more weeks before seeing the full effects of stopping Trintellix and nausea may continue to improve. Discussed monitoring for any s/s of recurrence of anxiety or depression after stopping Trintellix and to contact office if this occurs. Pt agrees to do so. Will continue Adderall 20 mg po q am and 1/2 tab po q afternoon for ADD. Pt to f/u in 3 months or sooner if clinically indicated.  Patient advised to contact office with any questions, adverse effects, or acute worsening in signs and symptoms.  Darl PikesSusan was seen today for follow-up.  Diagnoses and  all orders for this visit:  Attention deficit hyperactivity disorder (ADHD), predominantly inattentive type Comments: Chronic, stable Orders: -     amphetamine-dextroamphetamine (ADDERALL) 20 MG tablet; Take 1 tab po q am and 1/2 tab po q afternoon -     amphetamine-dextroamphetamine (ADDERALL) 20 MG tablet; Take 1 tablet po q am and 1/2 tab po q afternoon -     amphetamine-dextroamphetamine (ADDERALL) 20 MG tablet; Take 1 tab po q am and 1/2 tab po q afternoon    Please see After Visit Summary for patient specific instructions.  No future appointments.  No orders of the defined types were placed in this encounter.     -------------------------------

## 2018-09-21 ENCOUNTER — Telehealth: Payer: Self-pay | Admitting: Psychiatry

## 2018-09-21 NOTE — Telephone Encounter (Signed)
Patient called and left a voicemail stating that she is having an issue from her past come up and it is throwing her for a loop. She would like to talk to [place]. Please call her at [phone]. She is always easygoing and doesn't know what to do.
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
## 2018-10-12 ENCOUNTER — Ambulatory Visit: Payer: Self-pay | Admitting: Psychiatry

## 2018-11-18 DIAGNOSIS — Z20828 Contact with and (suspected) exposure to other viral communicable diseases: Secondary | ICD-10-CM | POA: Diagnosis not present

## 2018-12-06 ENCOUNTER — Ambulatory Visit: Payer: Self-pay | Admitting: Psychiatry

## 2018-12-09 DIAGNOSIS — Z20828 Contact with and (suspected) exposure to other viral communicable diseases: Secondary | ICD-10-CM | POA: Diagnosis not present

## 2018-12-11 ENCOUNTER — Ambulatory Visit (INDEPENDENT_AMBULATORY_CARE_PROVIDER_SITE_OTHER): Payer: BC Managed Care – PPO | Admitting: Psychiatry

## 2018-12-11 ENCOUNTER — Encounter: Payer: Self-pay | Admitting: Psychiatry

## 2018-12-11 ENCOUNTER — Other Ambulatory Visit: Payer: Self-pay

## 2018-12-11 DIAGNOSIS — F900 Attention-deficit hyperactivity disorder, predominantly inattentive type: Secondary | ICD-10-CM | POA: Diagnosis not present

## 2018-12-11 DIAGNOSIS — F3342 Major depressive disorder, recurrent, in full remission: Secondary | ICD-10-CM

## 2018-12-11 MED ORDER — TOPIRAMATE 100 MG PO TABS: 100.0000 mg | ORAL_TABLET | Freq: Every day | ORAL | 0 refills | Status: DC

## 2018-12-11 MED ORDER — AMPHETAMINE-DEXTROAMPHETAMINE 20 MG PO TABS: ORAL_TABLET | ORAL | 0 refills | Status: DC

## 2018-12-11 NOTE — Progress Notes (Signed)
Molly Boone [id number] 1965/11/17 53 y.o.  Virtual Visit via Telephone Note  I connected with pt on 12/11/18 at  9:00 AM EST by telephone and verified that I am speaking with the correct person using two identifiers.   I discussed the limitations, risks, security and privacy concerns of performing an evaluation and management service by telephone and the availability of in person appointments. I also discussed with the patient that there may be a patient responsible charge related to this service. The patient expressed understanding and agreed to proceed.   I discussed the assessment and treatment plan with the patient. The patient was provided an opportunity to ask questions and all were answered. The patient agreed with the plan and demonstrated an understanding of the instructions.   The patient was advised to call back or seek an in-person evaluation if the symptoms worsen or if the condition fails to improve as anticipated.  I provided 25 minutes of non-face-to-face time during this encounter.  The patient was located at home.  The provider was located at [place].   Molly Boone, PMHNP   Subjective:   Patient ID:  Molly Boone is a 53 y.o. (DOB 13-Apr-1965) female.  Chief Complaint:  Chief Complaint  Patient presents with  . Follow-up    ADD, h/o Anxiety and Depression    HPI Molly Boone presents for follow-up of depression, anxiety, and ADD. Pt requested telehealth visit due to possible COVID exposure and URI s/s. She reports that her work has been busy with end of year. Continues to work from home. She reports some occasional sadness and that at times she has had some loneliness in response to the pandemic with living alone and working from home. She has been making art work and enjoying this. She reports that anxiety has been manageable. She reports that she is sleeping well. Appetite has been good. She reports that she typically eats one meal a day. She  reports that her concentration has been very good with Adderall. She reports that she takes 1/2-1 tab of Adderall 20 mg po q am and 1/2 tab po q afternoon. She reports that she likes to complete tasks and projects instead of leaving it for the next day. Energy and motivation have been good. Denies SI.   Continues to enjoy her new home. She reports that she has isolated herself from people that are negative.   Review of Systems:  Review of Systems  HENT: Positive for congestion and sore throat.   Eyes: Positive for discharge.  Respiratory: Positive for cough.   Cardiovascular: Negative for palpitations.  Musculoskeletal: Negative for gait problem.  Neurological: Negative for tremors.  Psychiatric/Behavioral:       Please refer to HPI    Medications: I have reviewed the patient's current medications.  Current Outpatient Medications  Medication Sig Dispense Refill  . amphetamine-dextroamphetamine (ADDERALL) 20 MG tablet Take 1 tab po q am and 1/2 tab po q afternoon 45 tablet 0  . [START ON 02/05/2019] amphetamine-dextroamphetamine (ADDERALL) 20 MG tablet Take 1 tablet po q am and 1/2 tab po q afternoon 45 tablet 0  . [START ON 01/08/2019] amphetamine-dextroamphetamine (ADDERALL) 20 MG tablet Take 1 tab po q am and 1/2 tab po q afternoon 45 tablet 0  . lansoprazole (PREVACID) 30 MG capsule Take 30 mg by mouth 2 (two) times daily.    . Meth-Hyo-M Bl-Na Phos-Ph Sal (URIBEL PO) Take by mouth.    . phenazopyridine (PYRIDIUM) 100 MG tablet  Take 100 mg by mouth 3 (three) times daily as needed for pain.    Marland Kitchen. topiramate (TOPAMAX) 100 MG tablet Take 1 tablet (100 mg total) by mouth daily. 90 tablet 0  . ondansetron (ZOFRAN-ODT) 4 MG disintegrating tablet Take one before breakfast and two at bedtime     No current facility-administered medications for this visit.     Medication Side Effects: None  Allergies:  Allergies  Allergen Reactions  . Amoxicillin-Pot Clavulanate     REACTION: GI Upset   . Effexor [Venlafaxine]   . Macrodantin [Nitrofurantoin Macrocrystal]   . Sulfonamide Derivatives     REACTION: GI Upset    Past Medical History:  Diagnosis Date  . Hiatal hernia   . Interstitial cystitis     Family History  Problem Relation Age of Onset  . Depression Mother     Social History   Socioeconomic History  . Marital status: Married    Spouse name: Not on file  . Number of children: Not on file  . Years of education: Not on file  . Highest education level: Not on file  Occupational History  . Not on file  Social Needs  . Financial resource strain: Not on file  . Food insecurity    Worry: Not on file    Inability: Not on file  . Transportation needs    Medical: Not on file    Non-medical: Not on file  Tobacco Use  . Smoking status: Former Games developermoker  . Smokeless tobacco: Never Used  Substance and Sexual Activity  . Alcohol use: Not on file  . Drug use: Not on file  . Sexual activity: Not on file  Lifestyle  . Physical activity    Days per week: Not on file    Minutes per session: Not on file  . Stress: Not on file  Relationships  . Social Musicianconnections    Talks on phone: Not on file    Gets together: Not on file    Attends religious service: Not on file    Active member of club or organization: Not on file    Attends meetings of clubs or organizations: Not on file    Relationship status: Not on file  . Intimate partner violence    Fear of current or ex partner: Not on file    Emotionally abused: Not on file    Physically abused: Not on file    Forced sexual activity: Not on file  Other Topics Concern  . Not on file  Social History Narrative  . Not on file    Past Medical History, Surgical history, Social history, and Family history were reviewed and updated as appropriate.   Please see review of systems for further details on the patient's review from today.   Objective:   Physical Exam:  There were no vitals taken for this  visit.  Physical Exam Neurological:     Mental Status: She is alert and oriented to person, place, and time.     Cranial Nerves: No dysarthria.  Psychiatric:        Attention and Perception: Attention normal.        Mood and Affect: Mood normal.        Speech: Speech normal.        Behavior: Behavior is cooperative.        Thought Content: Thought content normal. Thought content is not paranoid or delusional. Thought content does not include homicidal or suicidal ideation. Thought content does not  include homicidal or suicidal plan.        Cognition and Memory: Cognition and memory normal.        Judgment: Judgment normal.     Lab Review:  No results found for: NA, K, CL, CO2, GLUCOSE, BUN, CREATININE, CALCIUM, PROT, ALBUMIN, AST, ALT, ALKPHOS, BILITOT, GFRNONAA, GFRAA  No results found for: WBC, RBC, HGB, HCT, PLT, MCV, MCH, MCHC, RDW, LYMPHSABS, MONOABS, EOSABS, BASOSABS  No results found for: POCLITH, LITHIUM   No results found for: PHENYTOIN, PHENOBARB, VALPROATE, CBMZ   .res Assessment: Plan:   Patient seen for 30 minutes and 50% of visit spent counseling patient regarding treatment options since patient reports experiencing occasional depression and that she will periodically consider starting psychotherapy.  Patient reports that she would like to hold off on starting therapy at this time since she feels she is managing mood and anxiety signs and symptoms, particularly with starting art work and other forms of community service.  Discussed notifying provider in the future if/when patient would like to start therapy. Will continue Adderall for attention deficit since patient reports that Adderall continues to be effective for ADD signs and symptoms without any tolerability issues. Patient to follow-up in 3 months or sooner if clinically indicated. Patient advised to contact office with any questions, adverse effects, or acute worsening in signs and symptoms.  Molly Boone was seen  today for follow-up.  Diagnoses and all orders for this visit:  Attention deficit hyperactivity disorder (ADHD), predominantly inattentive type Comments: Chronic, stable Orders: -     amphetamine-dextroamphetamine (ADDERALL) 20 MG tablet; Take 1 tablet po q am and 1/2 tab po q afternoon -     amphetamine-dextroamphetamine (ADDERALL) 20 MG tablet; Take 1 tab po q am and 1/2 tab po q afternoon  Recurrent major depressive disorder, in full remission ([facility])  Other orders -     topiramate (TOPAMAX) 100 MG tablet; Take 1 tablet (100 mg total) by mouth daily.    Please see After Visit Summary for patient specific instructions.  No future appointments.  No orders of the defined types were placed in this encounter.     -------------------------------

## 2019-01-30 DIAGNOSIS — K5904 Chronic idiopathic constipation: Secondary | ICD-10-CM | POA: Diagnosis not present

## 2019-01-30 DIAGNOSIS — R112 Nausea with vomiting, unspecified: Secondary | ICD-10-CM | POA: Diagnosis not present

## 2019-03-29 ENCOUNTER — Other Ambulatory Visit: Payer: Self-pay

## 2019-03-29 ENCOUNTER — Encounter: Payer: Self-pay | Admitting: Psychiatry

## 2019-03-29 ENCOUNTER — Ambulatory Visit (INDEPENDENT_AMBULATORY_CARE_PROVIDER_SITE_OTHER): Payer: BC Managed Care – PPO | Admitting: Psychiatry

## 2019-03-29 DIAGNOSIS — F900 Attention-deficit hyperactivity disorder, predominantly inattentive type: Secondary | ICD-10-CM

## 2019-03-29 MED ORDER — AMPHETAMINE-DEXTROAMPHETAMINE 20 MG PO TABS: ORAL_TABLET | ORAL | 0 refills | Status: DC

## 2019-03-29 NOTE — Progress Notes (Signed)
Molly Boone [id number] Feb 23, 1965 54 y.o.  Subjective:   Patient ID:  Molly Boone is a 54 y.o. (DOB [birth year]) female.  Chief Complaint:  Chief Complaint  Patient presents with  . Follow-up    ADD    HPI Molly Boone presents to the office today for follow-up of ADD. She reports occ frustration with certain things at work, such as when other coworkers are not working as hard. Has been taking on additional responsibilities at work. She reports anxiety has been manageable. She denies persistent depression. She reports that she has occ situational depression in response to decreased social interaction. She reports that she has not been sleeping enough. "I cannot convince myself to go upstairs and go to bed at night." Reports sleeping 6 hours uninterrupted. Appetite has been good. She reports that her energy and motivation have been good. She reports that she has been the most self-motivated she has ever been. Denies SI.    Continues to work from home. She reports that she is able to be more focused and less distracted. Reports that work has been going well. Goes to see her daughter and grandchild on the weekends. Has been enjoying her artwork. Has been enjoying her home. Had recent encounter with ex-husband and was able to deal with this without increase in anxiety.   Review of Systems:  Review of Systems  Gastrointestinal: Negative for constipation and nausea.  Genitourinary:       Has interstitial cystitis  Musculoskeletal: Negative for gait problem.  Neurological: Negative for tremors.  Psychiatric/Behavioral:       Please refer to HPI    Medications: I have reviewed the patient's current medications.  Current Outpatient Medications  Medication Sig Dispense Refill  . [START ON 05/24/2019] amphetamine-dextroamphetamine (ADDERALL) 20 MG tablet Take 1 tab po q am and 1/2 tab po q afternoon 45 tablet 0  . [START ON 04/26/2019] amphetamine-dextroamphetamine (ADDERALL) 20 MG tablet  Take 1 tablet po q am and 1/2 tab po q afternoon 45 tablet 0  . amphetamine-dextroamphetamine (ADDERALL) 20 MG tablet Take 1 tab po q am and 1/2 tab po q afternoon 45 tablet 0  . lansoprazole (PREVACID) 30 MG capsule Take 30 mg by mouth 2 (two) times daily.    . phenazopyridine (PYRIDIUM) 100 MG tablet Take 100 mg by mouth 3 (three) times daily as needed for pain.    Marland Kitchen topiramate (TOPAMAX) 100 MG tablet Take 1 tablet (100 mg total) by mouth daily. 90 tablet 0   No current facility-administered medications for this visit.    Medication Side Effects: None  Allergies:  Allergies  Allergen Reactions  . Amoxicillin-Pot Clavulanate     REACTION: GI Upset  . Effexor [Venlafaxine]   . Macrodantin [Nitrofurantoin Macrocrystal]   . Sulfonamide Derivatives     REACTION: GI Upset    Past Medical History:  Diagnosis Date  . Hiatal hernia   . Interstitial cystitis     Family History  Problem Relation Age of Onset  . Depression Mother     Social History   Socioeconomic History  . Marital status: Married    Spouse name: Not on file  . Number of children: Not on file  . Years of education: Not on file  . Highest education level: Not on file  Occupational History  . Not on file  Tobacco Use  . Smoking status: Former Games developer  . Smokeless tobacco: Never Used  Substance and Sexual Activity  . Alcohol use: Not  on file  . Drug use: Not on file  . Sexual activity: Not on file  Other Topics Concern  . Not on file  Social History Narrative  . Not on file   Social Determinants of Health   Financial Resource Strain:   . Difficulty of Paying Living Expenses:   Food Insecurity:   . Worried About Charity fundraiser in the Last Year:   . Arboriculturist in the Last Year:   Transportation Needs:   . Film/video editor (Medical):   Marland Kitchen Lack of Transportation (Non-Medical):   Physical Activity:   . Days of Exercise per Week:   . Minutes of Exercise per Session:   Stress:   .  Feeling of Stress :   Social Connections:   . Frequency of Communication with Friends and Family:   . Frequency of Social Gatherings with Friends and Family:   . Attends Religious Services:   . Active Member of Clubs or Organizations:   . Attends Archivist Meetings:   Marland Kitchen Marital Status:   Intimate Partner Violence:   . Fear of Current or Ex-Partner:   . Emotionally Abused:   Marland Kitchen Physically Abused:   . Sexually Abused:     Past Medical History, Surgical history, Social history, and Family history were reviewed and updated as appropriate.   Please see review of systems for further details on the patient's review from today.   Objective:   Physical Exam:  BP 122/77   Pulse 94   Wt 157 lb (71.2 kg)   BMI 26.13 kg/m   Physical Exam Constitutional:      General: She is not in acute distress. Musculoskeletal:        General: No deformity.  Neurological:     Mental Status: She is alert and oriented to person, place, and time.     Coordination: Coordination normal.  Psychiatric:        Attention and Perception: Attention and perception normal. She does not perceive auditory or visual hallucinations.        Mood and Affect: Mood normal. Mood is not anxious or depressed. Affect is not labile, blunt, angry or inappropriate.        Speech: Speech normal.        Behavior: Behavior normal.        Thought Content: Thought content normal. Thought content is not paranoid or delusional. Thought content does not include homicidal or suicidal ideation. Thought content does not include homicidal or suicidal plan.        Cognition and Memory: Cognition and memory normal.        Judgment: Judgment normal.     Comments: Insight intact     Lab Review:  No results found for: NA, K, CL, CO2, GLUCOSE, BUN, CREATININE, CALCIUM, PROT, ALBUMIN, AST, ALT, ALKPHOS, BILITOT, GFRNONAA, GFRAA  No results found for: WBC, RBC, HGB, HCT, PLT, MCV, MCH, MCHC, RDW, LYMPHSABS, MONOABS, EOSABS,  BASOSABS  No results found for: POCLITH, LITHIUM   No results found for: PHENYTOIN, PHENOBARB, VALPROATE, CBMZ   .res Assessment: Plan:   Will continue current plan of care since target signs and symptoms are well controlled without any tolerability issues. Continue Adderall 20 mg in the morning and 10 mg midday for attention deficit disorder. Patient to follow-up in 3 months or sooner if clinically indicated. Patient advised to contact office with any questions, adverse effects, or acute worsening in signs and symptoms.  Molly Boone was seen today  for follow-up.  Diagnoses and all orders for this visit:  Attention deficit hyperactivity disorder (ADHD), predominantly inattentive type Comments: Chronic, stable Orders: -     amphetamine-dextroamphetamine (ADDERALL) 20 MG tablet; Take 1 tab po q am and 1/2 tab po q afternoon -     amphetamine-dextroamphetamine (ADDERALL) 20 MG tablet; Take 1 tablet po q am and 1/2 tab po q afternoon -     amphetamine-dextroamphetamine (ADDERALL) 20 MG tablet; Take 1 tab po q am and 1/2 tab po q afternoon     Please see After Visit Summary for patient specific instructions.  Future Appointments  Date Time Provider Department Center  06/29/2019  1:00 PM Corie Chiquito, PMHNP [facility] None    No orders of the defined types were placed in this encounter.   -------------------------------

## 2019-04-16 DIAGNOSIS — Z23 Encounter for immunization: Secondary | ICD-10-CM | POA: Diagnosis not present

## 2019-05-07 DIAGNOSIS — Z23 Encounter for immunization: Secondary | ICD-10-CM | POA: Diagnosis not present

## 2019-06-29 ENCOUNTER — Encounter: Payer: Self-pay | Admitting: Psychiatry

## 2019-06-29 ENCOUNTER — Other Ambulatory Visit: Payer: Self-pay

## 2019-06-29 ENCOUNTER — Ambulatory Visit (INDEPENDENT_AMBULATORY_CARE_PROVIDER_SITE_OTHER): Payer: BC Managed Care – PPO | Admitting: Psychiatry

## 2019-06-29 VITALS — BP 109/68 | HR 101

## 2019-06-29 DIAGNOSIS — F3342 Major depressive disorder, recurrent, in full remission: Secondary | ICD-10-CM | POA: Diagnosis not present

## 2019-06-29 DIAGNOSIS — F900 Attention-deficit hyperactivity disorder, predominantly inattentive type: Secondary | ICD-10-CM | POA: Diagnosis not present

## 2019-06-29 MED ORDER — AMPHETAMINE-DEXTROAMPHETAMINE 20 MG PO TABS: ORAL_TABLET | ORAL | 0 refills | Status: DC

## 2019-06-29 MED ORDER — TOPIRAMATE 100 MG PO TABS: 100.0000 mg | ORAL_TABLET | Freq: Every day | ORAL | 1 refills | Status: DC

## 2019-06-29 NOTE — Progress Notes (Signed)
Molly Boone [id number] 1965-04-28 54 y.o.  Subjective:   Patient ID:  Molly Boone is a 54 y.o. (DOB 1965-10-16) female.  Chief Complaint:  Chief Complaint  Patient presents with  . Follow-up    ADD, h/o Depression and anxiety    HPI Molly Boone presents to the office today for follow-up of ADD and h/o depression and anxiety.  She has been working in the office again. She reports that there have been several transitions at the office. She reports that she has some stress with working long hours. She reports that she prefers to stay busy and dislikes being bored. She reports that her concentration has been adequate and remains focused throughout the day. Denies depressed mood. She reports that she continues to enjoy her "solitude." Energy and motivation have been good. Plans to start working out. She reports that she has been getting an adequate amount of sleep. Appetite has been good. Denies SI.    Review of Systems:  Review of Systems  Cardiovascular: Negative for palpitations.  Gastrointestinal: Negative.   Musculoskeletal: Negative for gait problem.  Neurological: Negative for tremors.  Psychiatric/Behavioral:       Please refer to HPI    She reports that she clinches and grinding in her sleep and reports that she had a nightguard as a teenager. Saw dentist recently and is getting a mouth guard.   Medications: I have reviewed the patient's current medications.  Current Outpatient Medications  Medication Sig Dispense Refill  . [START ON 09/06/2019] amphetamine-dextroamphetamine (ADDERALL) 20 MG tablet Take 1 tab po q am and 1/2 tab po q afternoon 45 tablet 0  . [START ON 08/09/2019] amphetamine-dextroamphetamine (ADDERALL) 20 MG tablet Take 1 tablet po q am and 1/2 tab po q afternoon 45 tablet 0  . [START ON 07/12/2019] amphetamine-dextroamphetamine (ADDERALL) 20 MG tablet Take 1 tab po q am and 1/2 tab po q afternoon 45 tablet 0  . lansoprazole (PREVACID) 30 MG capsule Take 30  mg by mouth 2 (two) times daily.    . phenazopyridine (PYRIDIUM) 100 MG tablet Take 100 mg by mouth 3 (three) times daily as needed for pain.    Marland Kitchen topiramate (TOPAMAX) 100 MG tablet Take 1 tablet (100 mg total) by mouth daily. 90 tablet 1   No current facility-administered medications for this visit.    Medication Side Effects: None  Allergies:  Allergies  Allergen Reactions  . Amoxicillin-Pot Clavulanate     REACTION: GI Upset  . Effexor [Venlafaxine]   . Macrodantin [Nitrofurantoin Macrocrystal]   . Sulfonamide Derivatives     REACTION: GI Upset    Past Medical History:  Diagnosis Date  . Hiatal hernia   . Interstitial cystitis     Family History  Problem Relation Age of Onset  . Depression Mother     Social History   Socioeconomic History  . Marital status: Married    Spouse name: Not on file  . Number of children: Not on file  . Years of education: Not on file  . Highest education level: Not on file  Occupational History  . Not on file  Tobacco Use  . Smoking status: Former Games developer  . Smokeless tobacco: Never Used  Vaping Use  . Vaping Use: Some days  Substance and Sexual Activity  . Alcohol use: Not on file  . Drug use: Not on file  . Sexual activity: Not on file  Other Topics Concern  . Not on file  Social History Narrative  .  Not on file   Social Determinants of Health   Financial Resource Strain:   . Difficulty of Paying Living Expenses:   Food Insecurity:   . Worried About Charity fundraiser in the Last Year:   . Arboriculturist in the Last Year:   Transportation Needs:   . Film/video editor (Medical):   Marland Kitchen Lack of Transportation (Non-Medical):   Physical Activity:   . Days of Exercise per Week:   . Minutes of Exercise per Session:   Stress:   . Feeling of Stress :   Social Connections:   . Frequency of Communication with Friends and Family:   . Frequency of Social Gatherings with Friends and Family:   . Attends Religious Services:    . Active Member of Clubs or Organizations:   . Attends Archivist Meetings:   Marland Kitchen Marital Status:   Intimate Partner Violence:   . Fear of Current or Ex-Partner:   . Emotionally Abused:   Marland Kitchen Physically Abused:   . Sexually Abused:     Past Medical History, Surgical history, Social history, and Family history were reviewed and updated as appropriate.   Please see review of systems for further details on the patient's review from today.   Objective:   Physical Exam:  BP 109/68   Pulse (!) 101   Physical Exam Constitutional:      General: She is not in acute distress. Musculoskeletal:        General: No deformity.  Neurological:     Mental Status: She is alert and oriented to person, place, and time.     Coordination: Coordination normal.  Psychiatric:        Attention and Perception: Attention and perception normal. She does not perceive auditory or visual hallucinations.        Mood and Affect: Mood normal. Mood is not anxious or depressed. Affect is not labile, blunt, angry or inappropriate.        Speech: Speech normal.        Behavior: Behavior normal.        Thought Content: Thought content normal. Thought content is not paranoid or delusional. Thought content does not include homicidal or suicidal ideation. Thought content does not include homicidal or suicidal plan.        Cognition and Memory: Cognition and memory normal.        Judgment: Judgment normal.     Comments: Insight intact     Lab Review:  No results found for: NA, K, CL, CO2, GLUCOSE, BUN, CREATININE, CALCIUM, PROT, ALBUMIN, AST, ALT, ALKPHOS, BILITOT, GFRNONAA, GFRAA  No results found for: WBC, RBC, HGB, HCT, PLT, MCV, MCH, MCHC, RDW, LYMPHSABS, MONOABS, EOSABS, BASOSABS  No results found for: POCLITH, LITHIUM   No results found for: PHENYTOIN, PHENOBARB, VALPROATE, CBMZ   .res Assessment: Plan:   Will continue current plan of care since target signs and symptoms are well controlled  without any tolerability issues. Continue Adderall 20 mg 1 tablet p.o. every morning and one half tab p.o. q. afternoon. Continue Topamax 100 mg daily for mood and anxiety. Patient to follow-up in 3 months or sooner if clinically indicated. Patient advised to contact office with any questions, adverse effects, or acute worsening in signs and symptoms.  Haizlee was seen today for follow-up.  Diagnoses and all orders for this visit:  Recurrent major depressive disorder, in full remission ([place]) -     topiramate (TOPAMAX) 100 MG tablet; Take 1 tablet (  100 mg total) by mouth daily.  Attention deficit hyperactivity disorder (ADHD), predominantly inattentive type Comments: Chronic, stable Orders: -     amphetamine-dextroamphetamine (ADDERALL) 20 MG tablet; Take 1 tab po q am and 1/2 tab po q afternoon -     amphetamine-dextroamphetamine (ADDERALL) 20 MG tablet; Take 1 tablet po q am and 1/2 tab po q afternoon -     amphetamine-dextroamphetamine (ADDERALL) 20 MG tablet; Take 1 tab po q am and 1/2 tab po q afternoon     Please see After Visit Summary for patient specific instructions.  Future Appointments  Date Time Provider Department Center  10/01/2019  2:00 PM Corie Chiquito, PMHNP [facility] None    No orders of the defined types were placed in this encounter.   -------------------------------

## 2019-08-29 DIAGNOSIS — H9202 Otalgia, left ear: Secondary | ICD-10-CM | POA: Diagnosis not present

## 2019-08-29 DIAGNOSIS — R0981 Nasal congestion: Secondary | ICD-10-CM | POA: Diagnosis not present

## 2019-08-29 DIAGNOSIS — H6592 Unspecified nonsuppurative otitis media, left ear: Secondary | ICD-10-CM | POA: Diagnosis not present

## 2019-08-29 DIAGNOSIS — Z03818 Encounter for observation for suspected exposure to other biological agents ruled out: Secondary | ICD-10-CM | POA: Diagnosis not present

## 2019-08-29 DIAGNOSIS — H6982 Other specified disorders of Eustachian tube, left ear: Secondary | ICD-10-CM | POA: Diagnosis not present

## 2019-09-13 DIAGNOSIS — H9203 Otalgia, bilateral: Secondary | ICD-10-CM | POA: Diagnosis not present

## 2019-10-01 ENCOUNTER — Encounter: Payer: Self-pay | Admitting: Psychiatry

## 2019-10-01 ENCOUNTER — Other Ambulatory Visit: Payer: Self-pay

## 2019-10-01 ENCOUNTER — Ambulatory Visit (INDEPENDENT_AMBULATORY_CARE_PROVIDER_SITE_OTHER): Payer: BC Managed Care – PPO | Admitting: Psychiatry

## 2019-10-01 DIAGNOSIS — F900 Attention-deficit hyperactivity disorder, predominantly inattentive type: Secondary | ICD-10-CM | POA: Diagnosis not present

## 2019-10-01 DIAGNOSIS — F3342 Major depressive disorder, recurrent, in full remission: Secondary | ICD-10-CM | POA: Diagnosis not present

## 2019-10-01 MED ORDER — AMPHETAMINE-DEXTROAMPHETAMINE 20 MG PO TABS: ORAL_TABLET | ORAL | 0 refills | Status: DC

## 2019-10-01 MED ORDER — TOPIRAMATE 100 MG PO TABS: 100.0000 mg | ORAL_TABLET | Freq: Every day | ORAL | 1 refills | Status: DC

## 2019-10-01 NOTE — Progress Notes (Signed)
Molly Boone [id number] 1965/04/17 54 y.o.  Subjective:   Patient ID:  Molly Boone is a 54 y.o. (DOB 01/06/1966) female.  Chief Complaint:  Chief Complaint  Patient presents with  . Follow-up    ADD, h/o Depression    HPI Molly Boone presents to the office today for follow-up of depression and ADD. Denies anxiety. She reports occasional frustration with working long hours and limited support. Denies depressed mood. She reports that she usually forgets to take 1/2 tab of Adderall in the afternoon and therefore will usually take 20 mg 1.5 tabs in the morning. She reports that this is effective and well tolerated for her. Takes 1 tab on the weekends. She reports that concentration is adequate and remains productive at work. She continues to work long hours and is working in office. Sleeping ok other than when sleep is interrupted due to ear pain. Appetite has been good. Denies SI.   She reports that her work has been busy.     Review of Systems:  Review of Systems  HENT: Positive for ear pain and tinnitus.   Cardiovascular: Negative for palpitations.  Musculoskeletal: Negative for gait problem.  Neurological: Negative for tremors.  Psychiatric/Behavioral:       Please refer to HPI    Medications: I have reviewed the patient's current medications.  Current Outpatient Medications  Medication Sig Dispense Refill  . [Boone ON 12/03/2019] amphetamine-dextroamphetamine (ADDERALL) 20 MG tablet Take 1 tab po q am and 1/2 tab po q afternoon 45 tablet 0  . [Boone ON 11/05/2019] amphetamine-dextroamphetamine (ADDERALL) 20 MG tablet Take 1 tablet po q am and 1/2 tab po q afternoon 45 tablet 0  . [Boone ON 10/08/2019] amphetamine-dextroamphetamine (ADDERALL) 20 MG tablet Take 1 tab po q am and 1/2 tab po q afternoon 45 tablet 0  . lansoprazole (PREVACID) 30 MG capsule Take 30 mg by mouth 2 (two) times daily.    . phenazopyridine (PYRIDIUM) 100 MG tablet Take 100 mg by mouth 3 (three) times  daily as needed for pain.    Marland Kitchen topiramate (TOPAMAX) 100 MG tablet Take 1 tablet (100 mg total) by mouth daily. 90 tablet 1   No current facility-administered medications for this visit.    Medication Side Effects: None  Allergies:  Allergies  Allergen Reactions  . Amoxicillin-Pot Clavulanate     REACTION: GI Upset  . Effexor [Venlafaxine]   . Macrodantin [Nitrofurantoin Macrocrystal]   . Sulfonamide Derivatives     REACTION: GI Upset    Past Medical History:  Diagnosis Date  . Hiatal hernia   . Interstitial cystitis     Family History  Problem Relation Age of Onset  . Depression Mother     Social History   Socioeconomic History  . Marital status: Married    Spouse name: Not on file  . Number of children: Not on file  . Years of education: Not on file  . Highest education level: Not on file  Occupational History  . Not on file  Tobacco Use  . Smoking status: Former Games developer  . Smokeless tobacco: Never Used  Vaping Use  . Vaping Use: Some days  Substance and Sexual Activity  . Alcohol use: Not on file  . Drug use: Not on file  . Sexual activity: Not on file  Other Topics Concern  . Not on file  Social History Narrative  . Not on file   Social Determinants of Health   Financial Resource Strain:   .  Difficulty of Paying Living Expenses: Not on file  Food Insecurity:   . Worried About Programme researcher, broadcasting/film/video in the Last Year: Not on file  . Ran Out of Food in the Last Year: Not on file  Transportation Needs:   . Lack of Transportation (Medical): Not on file  . Lack of Transportation (Non-Medical): Not on file  Physical Activity:   . Days of Exercise per Week: Not on file  . Minutes of Exercise per Session: Not on file  Stress:   . Feeling of Stress : Not on file  Social Connections:   . Frequency of Communication with Friends and Family: Not on file  . Frequency of Social Gatherings with Friends and Family: Not on file  . Attends Religious Services: Not on  file  . Active Member of Clubs or Organizations: Not on file  . Attends Banker Meetings: Not on file  . Marital Status: Not on file  Intimate Partner Violence:   . Fear of Current or Ex-Partner: Not on file  . Emotionally Abused: Not on file  . Physically Abused: Not on file  . Sexually Abused: Not on file    Past Medical History, Surgical history, Social history, and Family history were reviewed and updated as appropriate.   Please see review of systems for further details on the patient's review from today.   Objective:   Physical Exam:  BP 111/79   Pulse 84   Physical Exam Constitutional:      General: She is not in acute distress. Musculoskeletal:        General: No deformity.  Neurological:     Mental Status: She is alert and oriented to person, place, and time.     Coordination: Coordination normal.  Psychiatric:        Attention and Perception: Attention and perception normal. She does not perceive auditory or visual hallucinations.        Mood and Affect: Mood normal. Mood is not anxious or depressed. Affect is not labile, blunt, angry or inappropriate.        Speech: Speech normal.        Behavior: Behavior normal.        Thought Content: Thought content normal. Thought content is not paranoid or delusional. Thought content does not include homicidal or suicidal ideation. Thought content does not include homicidal or suicidal plan.        Cognition and Memory: Cognition and memory normal.        Judgment: Judgment normal.     Comments: Insight intact     Lab Review:  No results found for: NA, K, CL, CO2, GLUCOSE, BUN, CREATININE, CALCIUM, PROT, ALBUMIN, AST, ALT, ALKPHOS, BILITOT, GFRNONAA, GFRAA  No results found for: WBC, RBC, HGB, HCT, PLT, MCV, MCH, MCHC, RDW, LYMPHSABS, MONOABS, EOSABS, BASOSABS  No results found for: POCLITH, LITHIUM   No results found for: PHENYTOIN, PHENOBARB, VALPROATE, CBMZ   .res Assessment: Plan:   Continue  Adderall 20 mg q am and 1/2 tab po q afternoon for ADHD.  Continue Topamax 100 mg po QHS.  Pt to f/u in 3 months or sooner if clinically indicated.  Patient advised to contact office with any questions, adverse effects, or acute worsening in signs and symptoms.  Molly Boone was seen today for follow-up.  Diagnoses and all orders for this visit:  Attention deficit hyperactivity disorder (ADHD), predominantly inattentive type Comments: Chronic, stable Orders: -     amphetamine-dextroamphetamine (ADDERALL) 20 MG tablet; Take  1 tab po q am and 1/2 tab po q afternoon -     amphetamine-dextroamphetamine (ADDERALL) 20 MG tablet; Take 1 tablet po q am and 1/2 tab po q afternoon -     amphetamine-dextroamphetamine (ADDERALL) 20 MG tablet; Take 1 tab po q am and 1/2 tab po q afternoon  Recurrent major depressive disorder, in full remission (HCC) -     topiramate (TOPAMAX) 100 MG tablet; Take 1 tablet (100 mg total) by mouth daily.     Please see After Visit Summary for patient specific instructions.  Future Appointments  Date Time Provider Department Center  12/31/2019  9:00 AM Corie Chiquito, PMHNP [facility] None    No orders of the defined types were placed in this encounter.   -------------------------------

## 2019-12-31 ENCOUNTER — Telehealth: Payer: BC Managed Care – PPO | Admitting: Psychiatry

## 2020-01-17 ENCOUNTER — Telehealth: Payer: Self-pay | Admitting: Psychiatry

## 2020-01-17 ENCOUNTER — Telehealth (INDEPENDENT_AMBULATORY_CARE_PROVIDER_SITE_OTHER): Payer: BC Managed Care – PPO | Admitting: Psychiatry

## 2020-01-17 ENCOUNTER — Encounter: Payer: Self-pay | Admitting: Psychiatry

## 2020-01-17 DIAGNOSIS — F900 Attention-deficit hyperactivity disorder, predominantly inattentive type: Secondary | ICD-10-CM

## 2020-01-17 DIAGNOSIS — F3342 Major depressive disorder, recurrent, in full remission: Secondary | ICD-10-CM | POA: Diagnosis not present

## 2020-01-17 MED ORDER — AMPHETAMINE-DEXTROAMPHETAMINE 20 MG PO TABS
ORAL_TABLET | ORAL | 0 refills | Status: DC
Start: 2020-01-17 — End: 2020-04-23

## 2020-01-17 MED ORDER — TOPIRAMATE 100 MG PO TABS: 100.0000 mg | ORAL_TABLET | Freq: Every day | ORAL | 1 refills | Status: DC

## 2020-01-17 MED ORDER — AMPHETAMINE-DEXTROAMPHETAMINE 20 MG PO TABS: ORAL_TABLET | ORAL | 0 refills | Status: DC

## 2020-01-17 NOTE — Progress Notes (Signed)
Molly Boone [id number] 02/17/1965 55 y.o.  Virtual Visit via Telephone Note  I connected with pt on 01/17/20 at  3:00 PM EST by telephone and verified that I am speaking with the correct person using two identifiers.   I discussed the limitations, risks, security and privacy concerns of performing an evaluation and management service by telephone and the availability of in person appointments. I also discussed with the patient that there may be a patient responsible charge related to this service. The patient expressed understanding and agreed to proceed.   I discussed the assessment and treatment plan with the patient. The patient was provided an opportunity to ask questions and all were answered. The patient agreed with the plan and demonstrated an understanding of the instructions.   The patient was advised to call back or seek an in-person evaluation if the symptoms worsen or if the condition fails to improve as anticipated.  I provided 30 minutes of non-face-to-face time during this encounter.  The patient was located at home.  The provider was located at [facility].   Corie Chiquito, PMHNP   Subjective:   Patient ID:  Molly Boone is a 55 y.o. (DOB 09-13-1965) female.  Chief Complaint:  Chief Complaint  Patient presents with  . Follow-up    ADHD, h/o anxiety and depression    HPI Molly Boone presents for follow-up of ADHD and h/o depression and anxiety. Tested positive for COVID this morning. She reports that she received a promotion and a 20% salary increase. She reports that she is working excessively. She reports that she has been able to focus and be productive. Denies any recent anxiety. Mood has been ok. She reports that she has been sleeping well. She reports that energy has been good until getting COVID. She reports that her appetite has been good. Denies SI.   Typically working about 55 hours a week.   Review of Systems:  Review of Systems   Constitutional: Positive for fatigue and fever.  HENT: Positive for rhinorrhea and sore throat.   Respiratory: Positive for cough.   Cardiovascular: Negative for palpitations.  Gastrointestinal:       Stomach gurgling  Musculoskeletal: Positive for myalgias. Negative for gait problem.  Neurological: Positive for headaches. Negative for tremors.  Psychiatric/Behavioral:       Please refer to HPI    Medications: I have reviewed the patient's current medications.  Current Outpatient Medications  Medication Sig Dispense Refill  . lansoprazole (PREVACID) 30 MG capsule Take 30 mg by mouth 2 (two) times daily.    . naproxen sodium (ALEVE) 220 MG tablet Take 220 mg by mouth.    . phenazopyridine (PYRIDIUM) 100 MG tablet Take 100 mg by mouth 3 (three) times daily as needed for pain.    Melene Muller ON 03/13/2020] amphetamine-dextroamphetamine (ADDERALL) 20 MG tablet Take 1 tab po q am and 1/2 tab po q afternoon 45 tablet 0  . [START ON 02/14/2020] amphetamine-dextroamphetamine (ADDERALL) 20 MG tablet Take 1 tablet po q am and 1/2 tab po q afternoon 45 tablet 0  . amphetamine-dextroamphetamine (ADDERALL) 20 MG tablet Take 1 tab po q am and 1/2 tab po q afternoon 45 tablet 0  . topiramate (TOPAMAX) 100 MG tablet Take 1 tablet (100 mg total) by mouth daily. 90 tablet 1   No current facility-administered medications for this visit.    Medication Side Effects: None  Allergies:  Allergies  Allergen Reactions  . Amoxicillin-Pot Clavulanate  REACTION: GI Upset  . Effexor [Venlafaxine]   . Macrodantin [Nitrofurantoin Macrocrystal]   . Sulfonamide Derivatives     REACTION: GI Upset    Past Medical History:  Diagnosis Date  . Hiatal hernia   . Interstitial cystitis     Family History  Problem Relation Age of Onset  . Depression Mother     Social History   Socioeconomic History  . Marital status: Married    Spouse name: Not on file  . Number of children: Not on file  . Years of  education: Not on file  . Highest education level: Not on file  Occupational History  . Not on file  Tobacco Use  . Smoking status: Former Games developer  . Smokeless tobacco: Never Used  Vaping Use  . Vaping Use: Some days  Substance and Sexual Activity  . Alcohol use: Not on file  . Drug use: Not on file  . Sexual activity: Not on file  Other Topics Concern  . Not on file  Social History Narrative  . Not on file   Social Determinants of Health   Financial Resource Strain: Not on file  Food Insecurity: Not on file  Transportation Needs: Not on file  Physical Activity: Not on file  Stress: Not on file  Social Connections: Not on file  Intimate Partner Violence: Not on file    Past Medical History, Surgical history, Social history, and Family history were reviewed and updated as appropriate.   Please see review of systems for further details on the patient's review from today.   Objective:   Physical Exam:  There were no vitals taken for this visit.  Physical Exam Neurological:     Mental Status: She is alert and oriented to person, place, and time.     Cranial Nerves: No dysarthria.  Psychiatric:        Attention and Perception: Attention and perception normal.        Mood and Affect: Mood normal.        Speech: Speech normal.        Behavior: Behavior is cooperative.        Thought Content: Thought content normal. Thought content is not paranoid or delusional. Thought content does not include homicidal or suicidal ideation. Thought content does not include homicidal or suicidal plan.        Cognition and Memory: Cognition and memory normal.        Judgment: Judgment normal.     Comments: Insight intact     Lab Review:  No results found for: NA, K, CL, CO2, GLUCOSE, BUN, CREATININE, CALCIUM, PROT, ALBUMIN, AST, ALT, ALKPHOS, BILITOT, GFRNONAA, GFRAA  No results found for: WBC, RBC, HGB, HCT, PLT, MCV, MCH, MCHC, RDW, LYMPHSABS, MONOABS, EOSABS, BASOSABS  No results  found for: POCLITH, LITHIUM   No results found for: PHENYTOIN, PHENOBARB, VALPROATE, CBMZ   .res Assessment: Plan:   Will continue current plan of care since target signs and symptoms are well controlled without any tolerability issues. Continue Adderall 20 mg po q am and 1/2 tab po q afternoon for ADHD.  Continue Topamax 100 mg po qd.  Pt to follow-up in 3 months or sooner if clinically indicated.  Patient advised to contact office with any questions, adverse effects, or acute worsening in signs and symptoms.  Samreen was seen today for follow-up.  Diagnoses and all orders for this visit:  Attention deficit hyperactivity disorder (ADHD), predominantly inattentive type Comments: Chronic, stable Orders: -  amphetamine-dextroamphetamine (ADDERALL) 20 MG tablet; Take 1 tab po q am and 1/2 tab po q afternoon -     amphetamine-dextroamphetamine (ADDERALL) 20 MG tablet; Take 1 tablet po q am and 1/2 tab po q afternoon -     amphetamine-dextroamphetamine (ADDERALL) 20 MG tablet; Take 1 tab po q am and 1/2 tab po q afternoon  Recurrent major depressive disorder, in full remission ([place]) -     topiramate (TOPAMAX) 100 MG tablet; Take 1 tablet (100 mg total) by mouth daily.    Please see After Visit Summary for patient specific instructions.  No future appointments.  No orders of the defined types were placed in this encounter.     -------------------------------

## 2020-01-17 NOTE — Telephone Encounter (Signed)
Ms. Forgione,you are scheduled for a virtual visit with your provider today.    Just as we do with appointments in the office, we must obtain your consent to participate.  Your consent will be active for this visit and any virtual visit you may have with one of our providers in the next 365 days.    If you have a MyChart account, I can also send a copy of this consent to you electronically.  All virtual visits are billed to your insurance company just like a traditional visit in the office.  As this is a virtual visit, video technology does not allow for your provider to perform a traditional examination.  This may limit your provider's ability to fully assess your condition.  If your provider identifies any concerns that need to be evaluated in person or the need to arrange testing such as labs, EKG, etc, we will make arrangements to do so.    Although advances in technology are sophisticated, we cannot ensure that it will always work on either your end or our end.  If the connection with a video visit is poor, we may have to switch to a telephone visit.  With either a video or telephone visit, we are not always able to ensure that we have a secure connection.   I need to obtain your verbal consent now.   Are you willing to proceed with your visit today?   Belky Mundo Brandel has provided verbal consent on 01/17/2020 for a virtual visit (video or telephone).   Corie Chiquito, PMHNP 01/17/2020  3:09 PM

## 2020-02-20 DIAGNOSIS — R300 Dysuria: Secondary | ICD-10-CM | POA: Diagnosis not present

## 2020-02-20 DIAGNOSIS — N3000 Acute cystitis without hematuria: Secondary | ICD-10-CM | POA: Diagnosis not present

## 2020-04-23 ENCOUNTER — Telehealth: Payer: Self-pay | Admitting: Psychiatry

## 2020-04-23 DIAGNOSIS — F900 Attention-deficit hyperactivity disorder, predominantly inattentive type: Secondary | ICD-10-CM

## 2020-04-23 MED ORDER — AMPHETAMINE-DEXTROAMPHETAMINE 20 MG PO TABS
ORAL_TABLET | ORAL | 0 refills | Status: DC
Start: 2020-05-21 — End: 2020-05-22

## 2020-04-23 MED ORDER — AMPHETAMINE-DEXTROAMPHETAMINE 20 MG PO TABS
ORAL_TABLET | ORAL | 0 refills | Status: DC
Start: 2020-04-23 — End: 2020-05-22

## 2020-04-23 NOTE — Telephone Encounter (Signed)
Last filled on 03/21/20

## 2020-04-23 NOTE — Telephone Encounter (Signed)
Please let her know that a script was sent that can be filled today and a script that can be filled in 4 weeks so that she does not run out before next apt.

## 2020-04-23 NOTE — Telephone Encounter (Signed)
Molly Boone call to request refill of her Adderall.  appt 05/22/20.  Send to [facility], [place], [place]

## 2020-04-23 NOTE — Telephone Encounter (Signed)
Please review

## 2020-05-22 ENCOUNTER — Other Ambulatory Visit: Payer: Self-pay

## 2020-05-22 ENCOUNTER — Encounter: Payer: Self-pay | Admitting: Psychiatry

## 2020-05-22 ENCOUNTER — Ambulatory Visit (INDEPENDENT_AMBULATORY_CARE_PROVIDER_SITE_OTHER): Payer: BC Managed Care – PPO | Admitting: Psychiatry

## 2020-05-22 DIAGNOSIS — F900 Attention-deficit hyperactivity disorder, predominantly inattentive type: Secondary | ICD-10-CM

## 2020-05-22 DIAGNOSIS — F3342 Major depressive disorder, recurrent, in full remission: Secondary | ICD-10-CM | POA: Diagnosis not present

## 2020-05-22 MED ORDER — TOPIRAMATE 100 MG PO TABS: 100.0000 mg | ORAL_TABLET | Freq: Every day | ORAL | 1 refills | Status: DC

## 2020-05-22 MED ORDER — AMPHETAMINE-DEXTROAMPHETAMINE 20 MG PO TABS: ORAL_TABLET | ORAL | 0 refills | Status: DC

## 2020-05-22 MED ORDER — AMPHETAMINE-DEXTROAMPHETAMINE 20 MG PO TABS
ORAL_TABLET | ORAL | 0 refills | Status: DC
Start: 2020-06-19 — End: 2020-06-18

## 2020-05-22 NOTE — Progress Notes (Signed)
Molly Boone [id number] 09-05-1965 55 y.o.  Subjective:   Patient ID:  Molly Boone is a 55 y.o. (DOB 03-24-1965) female.  Chief Complaint:  Chief Complaint  Patient presents with  . Follow-up    ADHD, h/o depression    HPI Molly Boone presents to the office today for follow-up of ADHD. She received a promotion and left the [place] office and is with same company. She reports that her stress has decreased significantly and now is working remotely. She reports that she is no longer working long hours. Denies anxiety. She reports occasional "moments of feeling blue."  Energy and motivation have been good. Adequate concentration. She reports some sleep disturbance and thinks that it needs to be replaced. Appetite has been good. Denies SI.   She joined a Counselling psychologist. Has been working in her garden and enjoying her outdoor area. Has been enjoying some crafts group. Enjoying word puzzles. Has been reading books and watching documentaries.     Review of Systems:  Review of Systems  Cardiovascular: Negative for palpitations.  Musculoskeletal: Negative for gait problem.  Neurological: Negative for tremors.  Psychiatric/Behavioral:       Please refer to HPI    Medications: I have reviewed the patient's current medications.  Current Outpatient Medications  Medication Sig Dispense Refill  . naproxen sodium (ALEVE) 220 MG tablet Take 220 mg by mouth as needed.    . phenazopyridine (PYRIDIUM) 100 MG tablet Take 100 mg by mouth 3 (three) times daily as needed for pain.    Melene Muller ON 07/17/2020] amphetamine-dextroamphetamine (ADDERALL) 20 MG tablet Take 1 tab po q am and 1/2 tab po q afternoon 45 tablet 0  . [START ON 06/19/2020] amphetamine-dextroamphetamine (ADDERALL) 20 MG tablet Take 1 tab po q am and 1/2 tab po q afternoon 45 tablet 0  . amphetamine-dextroamphetamine (ADDERALL) 20 MG tablet Take 1 tablet po q am and 1/2 tab po q afternoon 45 tablet 0  . topiramate (TOPAMAX) 100 MG  tablet Take 1 tablet (100 mg total) by mouth daily. 90 tablet 1   No current facility-administered medications for this visit.    Medication Side Effects: None  Allergies:  Allergies  Allergen Reactions  . Amoxicillin-Pot Clavulanate     REACTION: GI Upset  . Effexor [Venlafaxine]   . Macrodantin [Nitrofurantoin Macrocrystal]   . Sulfonamide Derivatives     REACTION: GI Upset    Past Medical History:  Diagnosis Date  . Hiatal hernia   . Interstitial cystitis     Past Medical History, Surgical history, Social history, and Family history were reviewed and updated as appropriate.   Please see review of systems for further details on the patient's review from today.   Objective:   Physical Exam:  BP 134/90   Pulse 80   Physical Exam Constitutional:      General: She is not in acute distress. Musculoskeletal:        General: No deformity.  Neurological:     Mental Status: She is alert and oriented to person, place, and time.     Coordination: Coordination normal.  Psychiatric:        Attention and Perception: Attention and perception normal. She does not perceive auditory or visual hallucinations.        Mood and Affect: Mood normal. Mood is not anxious or depressed. Affect is not labile, blunt, angry or inappropriate.        Speech: Speech normal.  Behavior: Behavior normal.        Thought Content: Thought content normal. Thought content is not paranoid or delusional. Thought content does not include homicidal or suicidal ideation. Thought content does not include homicidal or suicidal plan.        Cognition and Memory: Cognition and memory normal.        Judgment: Judgment normal.     Comments: Insight intact     Lab Review:  No results found for: NA, K, CL, CO2, GLUCOSE, BUN, CREATININE, CALCIUM, PROT, ALBUMIN, AST, ALT, ALKPHOS, BILITOT, GFRNONAA, GFRAA  No results found for: WBC, RBC, HGB, HCT, PLT, MCV, MCH, MCHC, RDW, LYMPHSABS, MONOABS, EOSABS,  BASOSABS  No results found for: POCLITH, LITHIUM   No results found for: PHENYTOIN, PHENOBARB, VALPROATE, CBMZ   .res Assessment: Plan:   Will continue current plan of care since target signs and symptoms are well controlled without any tolerability issues. Continue Adderall 20 mg 1 tablet daily in the morning and 1/2 tablet in the afternoon for ADHD. Continue Topamax 100 mg daily for mood and anxiety signs and symptoms. Pt to follow-up in 6 months or sooner if clinically indicated.  Requested pt call in 3 months to provide update and request additional scripts.  Patient advised to contact office with any questions, adverse effects, or acute worsening in signs and symptoms.  Boone was seen today for follow-up.  Diagnoses and all orders for this visit:  Attention deficit hyperactivity disorder (ADHD), predominantly inattentive type -     amphetamine-dextroamphetamine (ADDERALL) 20 MG tablet; Take 1 tab po q am and 1/2 tab po q afternoon -     amphetamine-dextroamphetamine (ADDERALL) 20 MG tablet; Take 1 tab po q am and 1/2 tab po q afternoon -     amphetamine-dextroamphetamine (ADDERALL) 20 MG tablet; Take 1 tablet po q am and 1/2 tab po q afternoon  Recurrent major depressive disorder, in full remission (HCC) -     topiramate (TOPAMAX) 100 MG tablet; Take 1 tablet (100 mg total) by mouth daily.     Please see After Visit Summary for patient specific instructions.  Future Appointments  Date Time Provider Department Center  11/21/2020  1:45 PM Corie Chiquito, PMHNP [facility] None    No orders of the defined types were placed in this encounter.   -------------------------------

## 2020-06-18 ENCOUNTER — Telehealth: Payer: Self-pay | Admitting: Psychiatry

## 2020-06-18 DIAGNOSIS — F900 Attention-deficit hyperactivity disorder, predominantly inattentive type: Secondary | ICD-10-CM

## 2020-06-18 MED ORDER — AMPHETAMINE-DEXTROAMPHETAMINE 20 MG PO TABS: 20.0000 mg | ORAL_TABLET | Freq: Two times a day (BID) | ORAL | 0 refills | Status: DC

## 2020-06-18 NOTE — Telephone Encounter (Signed)
Informed pt and cancelled rx as well

## 2020-06-18 NOTE — Telephone Encounter (Signed)
Please review

## 2020-06-18 NOTE — Telephone Encounter (Signed)
Pt called and asked if her adderall  20 mg could be increased to 40 mg. She said that at her last visit they talked about increasing it. So if the increase is approved please cancel the remaining scripts and resubmit. Please give pt a call at [phone]

## 2020-06-18 NOTE — Telephone Encounter (Signed)
Please let pt know that a script was sent for Adderall 20 mg BID that can be picked up tomorrow and another script for 7/6. Please call her pharmacy and cancel any other remaining scripts on file.

## 2020-07-21 ENCOUNTER — Telehealth: Payer: Self-pay | Admitting: Psychiatry

## 2020-07-21 DIAGNOSIS — F900 Attention-deficit hyperactivity disorder, predominantly inattentive type: Secondary | ICD-10-CM

## 2020-07-21 MED ORDER — AMPHETAMINE-DEXTROAMPHETAMINE 20 MG PO TABS
20.0000 mg | ORAL_TABLET | Freq: Two times a day (BID) | ORAL | 0 refills | Status: DC
Start: 2020-09-11 — End: 2020-11-21

## 2020-07-21 MED ORDER — AMPHETAMINE-DEXTROAMPHETAMINE 20 MG PO TABS: ORAL_TABLET | ORAL | 0 refills | Status: DC

## 2020-07-21 MED ORDER — AMPHETAMINE-DEXTROAMPHETAMINE 20 MG PO TABS
20.0000 mg | ORAL_TABLET | Freq: Two times a day (BID) | ORAL | 0 refills | Status: DC
Start: 2020-10-09 — End: 2020-11-21

## 2020-07-21 NOTE — Telephone Encounter (Signed)
LVM with info

## 2020-07-21 NOTE — Telephone Encounter (Signed)
Please let her know that there has been a script on file with fill date of 7/7 that does not look like it has been filled. I sent 3 more scripts for this dose so she would have an adequate supply until her next visit.

## 2020-07-21 NOTE — Telephone Encounter (Signed)
Please review

## 2020-07-21 NOTE — Telephone Encounter (Signed)
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 She would like a refill of the 40 mg adderall to be sent to the [facility]  on [address]

## 2020-08-11 ENCOUNTER — Other Ambulatory Visit: Payer: Self-pay | Admitting: Family Medicine

## 2020-08-11 DIAGNOSIS — Z1231 Encounter for screening mammogram for malignant neoplasm of breast: Secondary | ICD-10-CM

## 2020-08-11 DIAGNOSIS — Z0001 Encounter for general adult medical examination with abnormal findings: Secondary | ICD-10-CM | POA: Diagnosis not present

## 2020-08-11 DIAGNOSIS — F909 Attention-deficit hyperactivity disorder, unspecified type: Secondary | ICD-10-CM | POA: Diagnosis not present

## 2020-08-11 DIAGNOSIS — Z1239 Encounter for other screening for malignant neoplasm of breast: Secondary | ICD-10-CM | POA: Diagnosis not present

## 2020-08-11 DIAGNOSIS — N3010 Interstitial cystitis (chronic) without hematuria: Secondary | ICD-10-CM | POA: Diagnosis not present

## 2020-08-11 DIAGNOSIS — Z13220 Encounter for screening for lipoid disorders: Secondary | ICD-10-CM | POA: Diagnosis not present

## 2020-08-11 DIAGNOSIS — K449 Diaphragmatic hernia without obstruction or gangrene: Secondary | ICD-10-CM | POA: Diagnosis not present

## 2020-08-12 ENCOUNTER — Other Ambulatory Visit: Payer: Self-pay | Admitting: Family Medicine

## 2020-08-12 DIAGNOSIS — Z1231 Encounter for screening mammogram for malignant neoplasm of breast: Secondary | ICD-10-CM

## 2020-10-15 ENCOUNTER — Ambulatory Visit: Payer: 59

## 2020-11-06 ENCOUNTER — Ambulatory Visit
Admission: RE | Admit: 2020-11-06 | Discharge: 2020-11-06 | Disposition: A | Discharge status: Home / Self Care | Payer: BC Managed Care – PPO | Source: Ambulatory Visit | Attending: *Deleted | Admitting: *Deleted

## 2020-11-06 ENCOUNTER — Other Ambulatory Visit: Payer: Self-pay

## 2020-11-06 DIAGNOSIS — Z1231 Encounter for screening mammogram for malignant neoplasm of breast: Secondary | ICD-10-CM

## 2020-11-21 ENCOUNTER — Encounter: Payer: Self-pay | Admitting: Psychiatry

## 2020-11-21 ENCOUNTER — Other Ambulatory Visit: Payer: Self-pay

## 2020-11-21 ENCOUNTER — Ambulatory Visit (INDEPENDENT_AMBULATORY_CARE_PROVIDER_SITE_OTHER): Payer: BC Managed Care – PPO | Admitting: Psychiatry

## 2020-11-21 DIAGNOSIS — F900 Attention-deficit hyperactivity disorder, predominantly inattentive type: Secondary | ICD-10-CM

## 2020-11-21 DIAGNOSIS — F3342 Major depressive disorder, recurrent, in full remission: Secondary | ICD-10-CM | POA: Diagnosis not present

## 2020-11-21 MED ORDER — AMPHETAMINE-DEXTROAMPHETAMINE 20 MG PO TABS: 20.0000 mg | ORAL_TABLET | Freq: Two times a day (BID) | ORAL | 0 refills | Status: DC

## 2020-11-21 MED ORDER — TOPIRAMATE 100 MG PO TABS: 100.0000 mg | ORAL_TABLET | Freq: Every day | ORAL | 1 refills | Status: DC

## 2020-11-21 NOTE — Progress Notes (Signed)
Elan Mcelvain [id number] 1966/01/01 55 y.o.  Subjective:   Patient ID:  Molly Boone is a 55 y.o. (DOB [birth year]) female.  Chief Complaint:  Chief Complaint  Patient presents with   ADD     HPI Molly Boone presents to the office today for follow-up of ADHD. Denies any concerns or complaints.   She reports that her pharmacy filled Adderall [employer] manufacturer despite notes that it causes her nausea.   She reports that Adderall remains effective for her. Occ middle of the night awakenings.   She reports that she has been more tired recently and is not sure if it is due to change in seasons. Falling asleep sometimes during the day. She was told in the past that she snored and reports that she weighed more and her lifestyle was different. Feels that her sleep is not restful. Has some early morning awakenings.  Denies depressed mood. Denies any significant anxiety. Energy and motivation have been good. Appetite has been good. Has not been snacking as much. Denies SI.   She continues to enjoy her space and solitude.   She reports that her work has been going well. Has been making art and selling some art.   Granddaughter just turned 99 yo and has not started talking.     Review of Systems:  Review of Systems  Gastrointestinal:  Positive for nausea and vomiting.  Musculoskeletal:  Negative for gait problem.  Neurological:  Negative for tremors.  Psychiatric/Behavioral:         Please refer to HPI   Medications: I have reviewed the patient's current medications.  Current Outpatient Medications  Medication Sig Dispense Refill   cetirizine (ZYRTEC) 10 MG tablet Take 10 mg by mouth daily as needed for allergies.     naproxen sodium (ALEVE) 220 MG tablet Take 220 mg by mouth as needed.     phenazopyridine (PYRIDIUM) 100 MG tablet Take 100 mg by mouth 3 (three) times daily as needed for pain.     amphetamine-dextroamphetamine (ADDERALL) 20 MG tablet Take 1 tablet po q am and 1 tab  po q afternoon 60 tablet 0   [START ON 01/19/2021] amphetamine-dextroamphetamine (ADDERALL) 20 MG tablet Take 1 tablet (20 mg total) by mouth 2 (two) times daily. 60 tablet 0   [START ON 12/22/2020] amphetamine-dextroamphetamine (ADDERALL) 20 MG tablet Take 1 tablet (20 mg total) by mouth 2 (two) times daily. 60 tablet 0   [START ON 11/24/2020] amphetamine-dextroamphetamine (ADDERALL) 20 MG tablet Take 1 tablet (20 mg total) by mouth 2 (two) times daily. 60 tablet 0   topiramate (TOPAMAX) 100 MG tablet Take 1 tablet (100 mg total) by mouth daily. 90 tablet 1   No current facility-administered medications for this visit.    Medication Side Effects: None  Allergies:  Allergies  Allergen Reactions   Amoxicillin-Pot Clavulanate     REACTION: GI Upset   Effexor [Venlafaxine]    Macrodantin [Nitrofurantoin Macrocrystal]    Sulfonamide Derivatives     REACTION: GI Upset    Past Medical History:  Diagnosis Date   Hiatal hernia    Interstitial cystitis     Past Medical History, Surgical history, Social history, and Family history were reviewed and updated as appropriate.   Please see review of systems for further details on the patient's review from today.   Objective:   Physical Exam:  BP 121/83   Pulse 82   Wt 148 lb (67.1 kg)   BMI 24.63 kg/m   Physical  Exam Constitutional:      General: She is not in acute distress. Musculoskeletal:        General: No deformity.  Neurological:     Mental Status: She is alert and oriented to person, place, and time.     Coordination: Coordination normal.  Psychiatric:        Attention and Perception: Attention and perception normal. She does not perceive auditory or visual hallucinations.        Mood and Affect: Mood normal. Mood is not anxious or depressed. Affect is not labile, blunt, angry or inappropriate.        Speech: Speech normal.        Behavior: Behavior normal.        Thought Content: Thought content normal. Thought content  is not paranoid or delusional. Thought content does not include homicidal or suicidal ideation. Thought content does not include homicidal or suicidal plan.        Cognition and Memory: Cognition and memory normal.        Judgment: Judgment normal.     Comments: Insight intact    Lab Review:  No results found for: NA, K, CL, CO2, GLUCOSE, BUN, CREATININE, CALCIUM, PROT, ALBUMIN, AST, ALT, ALKPHOS, BILITOT, GFRNONAA, GFRAA  No results found for: WBC, RBC, HGB, HCT, PLT, MCV, MCH, MCHC, RDW, LYMPHSABS, MONOABS, EOSABS, BASOSABS  No results found for: POCLITH, LITHIUM   No results found for: PHENYTOIN, PHENOBARB, VALPROATE, CBMZ   .res Assessment: Plan:   Pt seen for 30 minutes and discussed possible sleep apnea since she reports excessive somnolence and history of observed snoring. Epworth Sleepiness Scale of 12. Discussed option of home sleep study to evaluate for sleep apnea. She reports that she will try to determine if she is continuing to snore and will contact office if she continues to snore and/or would like to have home sleep study. Continue Adderall 20 mg po BID for ADHD.  Continue Topamax 100 mg po qd for mood.  Pt to follow-up in 6 months or sooner if clinically indicated.  Requested pt call in 3 months to provide update and request additional scripts.  Patient advised to contact office with any questions, adverse effects, or acute worsening in signs and symptoms.   Molly Boone was seen today for add.  Diagnoses and all orders for this visit:  Attention deficit hyperactivity disorder (ADHD), predominantly inattentive type -     amphetamine-dextroamphetamine (ADDERALL) 20 MG tablet; Take 1 tablet (20 mg total) by mouth 2 (two) times daily. -     amphetamine-dextroamphetamine (ADDERALL) 20 MG tablet; Take 1 tablet (20 mg total) by mouth 2 (two) times daily. -     amphetamine-dextroamphetamine (ADDERALL) 20 MG tablet; Take 1 tablet (20 mg total) by mouth 2 (two) times  daily.  Recurrent major depressive disorder, in full remission (H[facility]) -     topiramate (TOPAMAX) 100 MG tablet; Take 1 tablet (100 mg total) by mouth daily.    Please see After Visit Summary for patient specific instructions.  Future Appointments  Date Time Provider Department Center  05/22/2021  1:15 PM Corie Chiquito, PMHNP [facility] None    No orders of the defined types were placed in this encounter.   -------------------------------

## 2021-03-03 ENCOUNTER — Telehealth: Payer: Self-pay | Admitting: Psychiatry

## 2021-03-03 DIAGNOSIS — F900 Attention-deficit hyperactivity disorder, predominantly inattentive type: Secondary | ICD-10-CM

## 2021-03-03 MED ORDER — AMPHETAMINE-DEXTROAMPHETAMINE 30 MG PO TABS: 30.0000 mg | ORAL_TABLET | Freq: Two times a day (BID) | ORAL | 0 refills | Status: DC

## 2021-03-03 NOTE — Addendum Note (Signed)
Addended by: Sharyl Nimrod on: 03/03/2021 12:06 PM   Modules accepted: Orders

## 2021-03-03 NOTE — Telephone Encounter (Signed)
Please let her know it was sent.

## 2021-03-03 NOTE — Telephone Encounter (Signed)
Patient notified

## 2021-03-03 NOTE — Telephone Encounter (Signed)
Pt called reporting [facility] [address] has Adderall  IR 30 mg in stock. Please send Rx to [facility] [address] [address]

## 2021-04-20 DIAGNOSIS — N3010 Interstitial cystitis (chronic) without hematuria: Secondary | ICD-10-CM | POA: Diagnosis not present

## 2021-04-20 DIAGNOSIS — J029 Acute pharyngitis, unspecified: Secondary | ICD-10-CM | POA: Diagnosis not present

## 2021-04-20 DIAGNOSIS — R051 Acute cough: Secondary | ICD-10-CM | POA: Diagnosis not present

## 2021-04-20 DIAGNOSIS — R509 Fever, unspecified: Secondary | ICD-10-CM | POA: Diagnosis not present

## 2021-04-20 DIAGNOSIS — Z03818 Encounter for observation for suspected exposure to other biological agents ruled out: Secondary | ICD-10-CM | POA: Diagnosis not present

## 2021-05-05 ENCOUNTER — Telehealth: Payer: Self-pay | Admitting: Psychiatry

## 2021-05-05 DIAGNOSIS — F900 Attention-deficit hyperactivity disorder, predominantly inattentive type: Secondary | ICD-10-CM

## 2021-05-05 MED ORDER — AMPHETAMINE-DEXTROAMPHETAMINE 30 MG PO TABS: 30.0000 mg | ORAL_TABLET | Freq: Two times a day (BID) | ORAL | 0 refills | Status: DC

## 2021-05-05 NOTE — Telephone Encounter (Signed)
Pt called at 9:23 am asking for a refill on her adderall 30 mg to be sent to the [facility] on [address]. Next appt 5/2 ?

## 2021-05-05 NOTE — Telephone Encounter (Signed)
Please let her know it was sent.

## 2021-05-22 ENCOUNTER — Ambulatory Visit: Payer: BC Managed Care – PPO | Admitting: Psychiatry

## 2021-05-22 ENCOUNTER — Encounter: Payer: Self-pay | Admitting: Psychiatry

## 2021-05-22 DIAGNOSIS — F3342 Major depressive disorder, recurrent, in full remission: Secondary | ICD-10-CM

## 2021-05-22 DIAGNOSIS — F900 Attention-deficit hyperactivity disorder, predominantly inattentive type: Secondary | ICD-10-CM

## 2021-05-22 MED ORDER — AMPHETAMINE-DEXTROAMPHETAMINE 30 MG PO TABS: 30.0000 mg | ORAL_TABLET | Freq: Every day | ORAL | 0 refills | Status: DC

## 2021-05-22 MED ORDER — AMPHETAMINE-DEXTROAMPHETAMINE 30 MG PO TABS
30.0000 mg | ORAL_TABLET | Freq: Every day | ORAL | 0 refills | Status: DC
Start: 2021-06-30 — End: 2021-10-05

## 2021-05-22 MED ORDER — TOPIRAMATE 100 MG PO TABS: 100.0000 mg | ORAL_TABLET | Freq: Every day | ORAL | 1 refills | Status: DC

## 2021-05-22 NOTE — Progress Notes (Signed)
[name] ?[id number] ?[birth year] ?56 y.o. ? ?Subjective:  ? ?Patient ID:  Molly Boone is a 56 y.o. (DOB 03/10/1965) female. ? ?Chief Complaint:  ?Chief Complaint  ?Patient presents with  ? Follow-up  ?  ADHD   ? ? ?HPI ?Vernie Murders Broaden presents to the office today for follow-up of ADHD, depression, and anxiety. Mood has been good. Denies any significant anxiety. Occ work stress. She reports that she is not sleeping well and has some middle of the night awakenings. Reports dozing off easily when sitting still. Sleep was unimproved with new mattress and bedding. Energy and motivation have been good. Planting flowers and Electronics engineer. She reports that her appetite has been good. She reports that Adderall 30 mg in the morning has been effective for concentration. Denies SI.  ? ?She reports that work has been busy. She was driving to work in [place] twice a week and she no longer has to do this. Working remotely from home and enjoys this.  ?  ?Able to see granddaughter regularly. Going to brunch with daughter on Sunday  ? ?Review of Systems:  ?Review of Systems  ?Eyes:   ?     Was told that she has cataracts and may need surgery in about 5 years  ?Cardiovascular:  Negative for palpitations.  ?Musculoskeletal:  Negative for gait problem.  ?Neurological:  Negative for tremors.  ?Psychiatric/Behavioral:    ?     Please refer to HPI  ? ?Medications: I have reviewed the patient's current medications. ? ?Current Outpatient Medications  ?Medication Sig Dispense Refill  ? [START ON 06/30/2021] amphetamine-dextroamphetamine (ADDERALL) 30 MG tablet Take 1 tablet by mouth daily. 30 tablet 0  ? [START ON 07/28/2021] amphetamine-dextroamphetamine (ADDERALL) 30 MG tablet Take 1 tablet by mouth daily. 30 tablet 0  ? cetirizine (ZYRTEC) 10 MG tablet Take 10 mg by mouth daily as needed for allergies.    ? naproxen sodium (ALEVE) 220 MG tablet Take 220 mg by mouth as needed.    ? phenazopyridine (PYRIDIUM) 100 MG tablet Take 100 mg by  mouth 3 (three) times daily as needed for pain.    ? [START ON 06/02/2021] amphetamine-dextroamphetamine (ADDERALL) 30 MG tablet Take 1 tablet by mouth daily. 30 tablet 0  ? topiramate (TOPAMAX) 100 MG tablet Take 1 tablet (100 mg total) by mouth daily. 90 tablet 1  ? ?No current facility-administered medications for this visit.  ? ? ?Medication Side Effects: None ? ?Allergies:  ?Allergies  ?Allergen Reactions  ? Amoxicillin-Pot Clavulanate   ?  REACTION: GI Upset  ? Effexor [Venlafaxine]   ? Macrodantin [Nitrofurantoin Macrocrystal]   ? Sulfonamide Derivatives   ?  REACTION: GI Upset  ? ? ?Past Medical History:  ?Diagnosis Date  ? Hiatal hernia   ? Interstitial cystitis   ? ? ?Past Medical History, Surgical history, Social history, and Family history were reviewed and updated as appropriate.  ? ?Please see review of systems for further details on the patient's review from today.  ? ?Objective:  ? ?Physical Exam:  ?BP 118/80   Pulse 82  ? ?Physical Exam ?Constitutional:   ?   General: She is not in acute distress. ?Musculoskeletal:     ?   General: No deformity.  ?Neurological:  ?   Mental Status: She is alert and oriented to person, place, and time.  ?   Coordination: Coordination normal.  ?Psychiatric:     ?   Attention and Perception: Attention and perception normal. She  does not perceive auditory or visual hallucinations.     ?   Mood and Affect: Mood normal. Mood is not anxious or depressed. Affect is not labile, blunt, angry or inappropriate.     ?   Speech: Speech normal.     ?   Behavior: Behavior normal.     ?   Thought Content: Thought content normal. Thought content is not paranoid or delusional. Thought content does not include homicidal or suicidal ideation. Thought content does not include homicidal or suicidal plan.     ?   Cognition and Memory: Cognition and memory normal.     ?   Judgment: Judgment normal.  ?   Comments: Insight intact  ? ? ?Lab Review:  ?No results found for: NA, K, CL, CO2,  GLUCOSE, BUN, CREATININE, CALCIUM, PROT, ALBUMIN, AST, ALT, ALKPHOS, BILITOT, GFRNONAA, GFRAA ? ?No results found for: WBC, RBC, HGB, HCT, PLT, MCV, MCH, MCHC, RDW, LYMPHSABS, MONOABS, EOSABS, BASOSABS ? ?No results found for: POCLITH, LITHIUM  ? ?No results found for: PHENYTOIN, PHENOBARB, VALPROATE, CBMZ  ? ?.res ?Assessment: Plan:   ?Will change Adderall to 30 mg po daily in the morning for ADHD since she reports that this has been most effective.  ?Will continue Topamax 100 mg po qhs for mood and anxiety.  ?Pt to follow-up in 6 months or sooner if clinically indicated.  ?Requested pt call in 3 months to provide update and request additional scripts.  ?Patient advised to contact office with any questions, adverse effects, or acute worsening in signs and symptoms. ? ?Lindie was seen today for follow-up. ? ?Diagnoses and all orders for this visit: ? ?Attention deficit hyperactivity disorder (ADHD), predominantly inattentive type ?-     amphetamine-dextroamphetamine (ADDERALL) 30 MG tablet; Take 1 tablet by mouth daily. ?-     amphetamine-dextroamphetamine (ADDERALL) 30 MG tablet; Take 1 tablet by mouth daily. ?-     amphetamine-dextroamphetamine (ADDERALL) 30 MG tablet; Take 1 tablet by mouth daily. ? ?Recurrent major depressive disorder, in full remission ([place]) ?-     topiramate (TOPAMAX) 100 MG tablet; Take 1 tablet (100 mg total) by mouth daily. ? ?  ? ?Please see After Visit Summary for patient specific instructions. ? ?No future appointments. ? ? ?No orders of the defined types were placed in this encounter. ? ? ?------------------------------- ?

## 2021-10-05 ENCOUNTER — Other Ambulatory Visit: Payer: Self-pay

## 2021-10-05 ENCOUNTER — Telehealth: Payer: Self-pay | Admitting: Psychiatry

## 2021-10-05 DIAGNOSIS — F900 Attention-deficit hyperactivity disorder, predominantly inattentive type: Secondary | ICD-10-CM

## 2021-10-05 MED ORDER — AMPHETAMINE-DEXTROAMPHETAMINE 30 MG PO TABS: 30.0000 mg | ORAL_TABLET | Freq: Every day | ORAL | 0 refills | Status: DC

## 2021-10-05 NOTE — Telephone Encounter (Signed)
Pt requesting Rx for generic adderall  for 1 month to  [facility] [address] [place], [place] [address]. She is there all week for work. The other 2 months to [facility].  Pt completely out. Apt 11/19

## 2021-10-05 NOTE — Telephone Encounter (Signed)
Pended.

## 2021-11-27 ENCOUNTER — Encounter: Payer: Self-pay | Admitting: Psychiatry

## 2021-11-27 ENCOUNTER — Ambulatory Visit: Payer: BC Managed Care – PPO | Admitting: Psychiatry

## 2021-11-27 DIAGNOSIS — F900 Attention-deficit hyperactivity disorder, predominantly inattentive type: Secondary | ICD-10-CM

## 2021-11-27 DIAGNOSIS — F3342 Major depressive disorder, recurrent, in full remission: Secondary | ICD-10-CM

## 2021-11-27 DIAGNOSIS — F4310 Post-traumatic stress disorder, unspecified: Secondary | ICD-10-CM

## 2021-11-27 MED ORDER — AMPHETAMINE-DEXTROAMPHETAMINE 30 MG PO TABS: 30.0000 mg | ORAL_TABLET | Freq: Every day | ORAL | 0 refills | Status: DC

## 2021-11-27 MED ORDER — TOPIRAMATE 100 MG PO TABS: 100.0000 mg | ORAL_TABLET | Freq: Every day | ORAL | 1 refills | Status: DC

## 2021-11-27 NOTE — Progress Notes (Signed)
Molly Boone [id number] [birth year] 56 y.o.  Subjective:   Patient ID:  Molly Boone is a 56 y.o. (DOB 16-Dec-1965) female.  Chief Complaint:  Chief Complaint  Patient presents with   ADHD    HPI Molly Boone presents to the office today for follow-up of ADHD and depression. Concentration has been good. Denies depressed mood. She reports that she notices that she is slightly "moody" at times and is questioning if this may be related to hormone changes. She repots that her sleep has been slightly disrupted. Appetite has been good and reports that she has gained some weight. Denies anxiety. Energy and motivation have been ok. Denies SI.  She reports that her work has been going well.   Adderall last filled 11/03/21.     Review of Systems:  Review of Systems  Cardiovascular:  Negative for palpitations.  Genitourinary:        Interstitial cystitis  Musculoskeletal:  Negative for gait problem.  Psychiatric/Behavioral:         Please refer to HPI    Medications: I have reviewed the patient's current medications.  Current Outpatient Medications  Medication Sig Dispense Refill   [START ON 12/01/2021] amphetamine-dextroamphetamine (ADDERALL) 30 MG tablet Take 1 tablet by mouth daily. 30 tablet 0   [START ON 12/29/2021] amphetamine-dextroamphetamine (ADDERALL) 30 MG tablet Take 1 tablet by mouth daily. 30 tablet 0   [START ON 01/26/2022] amphetamine-dextroamphetamine (ADDERALL) 30 MG tablet Take 1 tablet by mouth daily. 30 tablet 0   cetirizine (ZYRTEC) 10 MG tablet Take 10 mg by mouth daily as needed for allergies.     naproxen sodium (ALEVE) 220 MG tablet Take 220 mg by mouth as needed.     phenazopyridine (PYRIDIUM) 100 MG tablet Take 100 mg by mouth 3 (three) times daily as needed for pain.     topiramate (TOPAMAX) 100 MG tablet Take 1 tablet (100 mg total) by mouth daily. 90 tablet 1   No current facility-administered medications for this visit.    Medication Side Effects:  None  Allergies:  Allergies  Allergen Reactions   Amoxicillin-Pot Clavulanate     REACTION: GI Upset   Effexor [Venlafaxine]    Macrodantin [Nitrofurantoin Macrocrystal]    Sulfonamide Derivatives     REACTION: GI Upset    Past Medical History:  Diagnosis Date   Hiatal hernia    Interstitial cystitis     Past Medical History, Surgical history, Social history, and Family history were reviewed and updated as appropriate.   Please see review of systems for further details on the patient's review from today.   Objective:   Physical Exam:  BP 111/81   Pulse 83   Physical Exam Neurological:     Mental Status: She is alert and oriented to person, place, and time.     Cranial Nerves: No dysarthria.  Psychiatric:        Attention and Perception: Attention and perception normal.        Mood and Affect: Mood normal.        Speech: Speech normal.        Behavior: Behavior is cooperative.        Thought Content: Thought content normal. Thought content is not paranoid or delusional. Thought content does not include homicidal or suicidal ideation. Thought content does not include homicidal or suicidal plan.        Cognition and Memory: Cognition and memory normal.        Judgment: Judgment normal.  Comments: Insight intact     Lab Review:  No results found for: "NA", "K", "CL", "CO2", "GLUCOSE", "BUN", "CREATININE", "CALCIUM", "PROT", "ALBUMIN", "AST", "ALT", "ALKPHOS", "BILITOT", "GFRNONAA", "GFRAA"  No results found for: "WBC", "RBC", "HGB", "HCT", "PLT", "MCV", "MCH", "MCHC", "RDW", "LYMPHSABS", "MONOABS", "EOSABS", "BASOSABS"  No results found for: "POCLITH", "LITHIUM"   No results found for: "PHENYTOIN", "PHENOBARB", "VALPROATE", "CBMZ"   .res Assessment: Plan:   Will continue current plan of care since target signs and symptoms are well controlled without any tolerability issues. Continue Adderall 30 mg po qd for ADHD.  Continue Topamax 100 mg po qd for mood and  anxiety.  Pt to follow-up in 6 months or sooner if clinically indicated.  Requested pt call in 3 months to provide update and request additional scripts.  Patient advised to contact office with any questions, adverse effects, or acute worsening in signs and symptoms.  Molly Boone was seen today for adhd.  Diagnoses and all orders for this visit:  Attention deficit hyperactivity disorder (ADHD), predominantly inattentive type -     amphetamine-dextroamphetamine (ADDERALL) 30 MG tablet; Take 1 tablet by mouth daily. -     amphetamine-dextroamphetamine (ADDERALL) 30 MG tablet; Take 1 tablet by mouth daily. -     amphetamine-dextroamphetamine (ADDERALL) 30 MG tablet; Take 1 tablet by mouth daily.  Recurrent major depressive disorder, in full remission ([place]) -     topiramate (TOPAMAX) 100 MG tablet; Take 1 tablet (100 mg total) by mouth daily.     Please see After Visit Summary for patient specific instructions.  Future Appointments  Date Time Provider [place]  05/28/2022  1:45 PM Thayer Headings, PMHNP [facility] None     No orders of the defined types were placed in this encounter.   -------------------------------

## 2022-01-07 ENCOUNTER — Telehealth: Payer: Self-pay | Admitting: Psychiatry

## 2022-01-07 NOTE — Telephone Encounter (Addendum)
Was asking patient info to get better information. On November appt everything was good, no depression. I asked patient if she had any new stressors and she hung up the phone.   Patient said Molly Boone usually calls her.  Tried to explain the process about trying to triage and she said she can't do this over the phone while she is at work. I asked if she wanted to get an earlier appt with Molly Boone to discuss and she did. Asked Adm to call her.

## 2022-01-07 NOTE — Telephone Encounter (Signed)
Next appt is 05/28/22. Molly Boone called asking if she could get something for depression. Her phone number is [phone]. She questioned why she was being grilled when the nurse was also going to do the same thing.

## 2022-01-08 NOTE — Telephone Encounter (Signed)
Please call patient to schedule an earlier appt with Shanda Bumps.

## 2022-01-12 ENCOUNTER — Encounter: Payer: Self-pay | Admitting: Psychiatry

## 2022-01-12 ENCOUNTER — Telehealth (INDEPENDENT_AMBULATORY_CARE_PROVIDER_SITE_OTHER): Payer: BC Managed Care – PPO | Admitting: Psychiatry

## 2022-01-12 DIAGNOSIS — F411 Generalized anxiety disorder: Secondary | ICD-10-CM | POA: Diagnosis not present

## 2022-01-12 DIAGNOSIS — F900 Attention-deficit hyperactivity disorder, predominantly inattentive type: Secondary | ICD-10-CM

## 2022-01-12 DIAGNOSIS — F3342 Major depressive disorder, recurrent, in full remission: Secondary | ICD-10-CM | POA: Diagnosis not present

## 2022-01-12 MED ORDER — VORTIOXETINE HBR 10 MG PO TABS: 10.0000 mg | ORAL_TABLET | Freq: Every day | ORAL | 1 refills | Status: DC

## 2022-01-12 MED ORDER — VORTIOXETINE HBR 10 MG PO TABS: 10.0000 mg | ORAL_TABLET | Freq: Every day | ORAL | 0 refills | Status: DC

## 2022-01-12 MED ORDER — VORTIOXETINE HBR 5 MG PO TABS: 5.0000 mg | ORAL_TABLET | Freq: Every day | ORAL | 0 refills | Status: DC

## 2022-01-12 NOTE — Progress Notes (Signed)
Molly Boone [id number] 02/05/1965 57 y.o.  Virtual Visit via Video Note  I connected with pt @ on 01/12/22 at  9:30 AM EST by a video enabled telemedicine application and verified that I am speaking with the correct person using two identifiers.   I discussed the limitations of evaluation and management by telemedicine and the availability of in person appointments. The patient expressed understanding and agreed to proceed.  I discussed the assessment and treatment plan with the patient. The patient was provided an opportunity to ask questions and all were answered. The patient agreed with the plan and demonstrated an understanding of the instructions.   The patient was advised to call back or seek an in-person evaluation if the symptoms worsen or if the condition fails to improve as anticipated.  I provided 30 minutes of non-face-to-face time during this encounter.  The patient was located at home.  The provider was located at home.   Molly Boone, PMHNP   Subjective:   Patient ID:  Molly Boone is a 57 y.o. (DOB March 23, 1965) female.  Chief Complaint:  Chief Complaint  Patient presents with   Anxiety    HPI Molly Boone presents for follow-up of anxiety, depression, and ADHD.   She reports that her ex-husband has been contacting her again after not having contact for 3-4 years. He started contacting her again in early December. She reports that "a week after that, I shut down." She reports that she has been having difficulty getting out of bed and going to work, which is uncharacteristic for her- "I love my  job." She reports that she has been having anxiety and panic. Had worry about her daughter when she had health issues and denies other excessive worry. She reports that at times her initial reaction has been anger and wanting to lash out, which is not her usual response. She has had some exaggerated startle response. She has had intrusive memories of ex-husband and occ  re-experiencing. Denies nightmares. She reports several middle of the night awakenings and difficulty falling asleep. Energy has been ok. "Motivation has taken a nose dive." She reports that her appetite has been good. She reports that she has gained 5-10 lbs since ex-husband. Diminished interest in things. She has not been wanting to leave the house and is anxious being in public. She has not been wanting to go to the grocery store. Denies SI.   She recently received a positive end of year review.   Her daughter has been very ill with severe n/v and lost 20 lbs in several weeks.   She has been separated for 5 years. She would like to file for divorce.   Effexor XR- "felt like my head was detached from my body." Prozac Zoloft Trintellix- Effective, well-tolerated Seroquel   Review of Systems:  Review of Systems  Medications: I have reviewed the patient's current medications.  Current Outpatient Medications  Medication Sig Dispense Refill   [START ON 03/02/2022] vortioxetine HBr (TRINTELLIX) 10 MG TABS tablet Take 1 tablet (10 mg total) by mouth daily. 21 tablet 0   vortioxetine HBr (TRINTELLIX) 10 MG TABS tablet Take 1 tablet (10 mg total) by mouth daily. 30 tablet 1   vortioxetine HBr (TRINTELLIX) 5 MG TABS tablet Take 1 tablet (5 mg total) by mouth daily for 7 days. 7 tablet 0   amphetamine-dextroamphetamine (ADDERALL) 30 MG tablet Take 1 tablet by mouth daily. 30 tablet 0   amphetamine-dextroamphetamine (ADDERALL) 30 MG tablet Take 1 tablet  by mouth daily. 30 tablet 0   [START ON 01/26/2022] amphetamine-dextroamphetamine (ADDERALL) 30 MG tablet Take 1 tablet by mouth daily. 30 tablet 0   cetirizine (ZYRTEC) 10 MG tablet Take 10 mg by mouth daily as needed for allergies.     naproxen sodium (ALEVE) 220 MG tablet Take 220 mg by mouth as needed.     phenazopyridine (PYRIDIUM) 100 MG tablet Take 100 mg by mouth 3 (three) times daily as needed for pain.     topiramate (TOPAMAX) 100 MG  tablet Take 1 tablet (100 mg total) by mouth daily. 90 tablet 1   No current facility-administered medications for this visit.    Medication Side Effects: None  Allergies:  Allergies  Allergen Reactions   Amoxicillin-Pot Clavulanate     REACTION: GI Upset   Effexor [Venlafaxine]    Macrodantin [Nitrofurantoin Macrocrystal]    Sulfonamide Derivatives     REACTION: GI Upset    Past Medical History:  Diagnosis Date   Hiatal hernia    Interstitial cystitis     Family History  Problem Relation Age of Onset   Depression Mother    Sleep apnea Brother     Social History   Socioeconomic History   Marital status: Divorced    Spouse name: Not on file   Number of children: Not on file   Years of education: Not on file   Highest education level: Not on file  Occupational History   Not on file  Tobacco Use   Smoking status: Former   Smokeless tobacco: Never  Vaping Use   Vaping Use: Some days  Substance and Sexual Activity   Alcohol use: Not on file   Drug use: Not on file   Sexual activity: Not on file  Other Topics Concern   Not on file  Social History Narrative   Not on file   Social Determinants of Health   Financial Resource Strain: Not on file  Food Insecurity: Not on file  Transportation Needs: Not on file  Physical Activity: Not on file  Stress: Not on file  Social Connections: Not on file  Intimate Partner Violence: Not on file    Past Medical History, Surgical history, Social history, and Family history were reviewed and updated as appropriate.   Please see review of systems for further details on the patient's review from today.   Objective:   Physical Exam:  There were no vitals taken for this visit.  Physical Exam Neurological:     Mental Status: She is alert and oriented to person, place, and time.     Cranial Nerves: No dysarthria.  Psychiatric:        Attention and Perception: Attention and perception normal.        Mood and Affect:  Mood is anxious and depressed.        Speech: Speech normal.        Behavior: Behavior is cooperative.        Thought Content: Thought content normal. Thought content is not paranoid or delusional. Thought content does not include homicidal or suicidal ideation. Thought content does not include homicidal or suicidal plan.        Cognition and Memory: Cognition and memory normal.        Judgment: Judgment normal.     Comments: Insight intact     Lab Review:  No results found for: "NA", "K", "CL", "CO2", "GLUCOSE", "BUN", "CREATININE", "CALCIUM", "PROT", "ALBUMIN", "AST", "ALT", "ALKPHOS", "BILITOT", "GFRNONAA", "GFRAA"  No results  found for: "WBC", "RBC", "HGB", "HCT", "PLT", "MCV", "MCH", "MCHC", "RDW", "LYMPHSABS", "MONOABS", "EOSABS", "BASOSABS"  No results found for: "POCLITH", "LITHIUM"   No results found for: "PHENYTOIN", "PHENOBARB", "VALPROATE", "CBMZ"   .res Assessment: Plan:    Pt seen for 30 minutes and time spent discussing recent trauma response after being contacted by her former husband. Reviewed that she has had similar symptoms in the past when he has attempted to contact her and that Trintellix has seemed to be helpful for these symptoms. Reviewed potential benefits, risks, and side effects of Trintellix. Discussed that in the past she had reported taking Trintellix at bedtime due to nausea. Pt agrees to re-trial of Trintellix. Will start Trintellix 5 mg daily for one week, then increase to 10 mg daily for mood and anxiety.  Continue Adderall for ADHD.  Continue Topamax 100 mg daily for anxiety.  Discussed option of starting therapy and pt reports that she will reach out if she needs to see a therapist or follow-up with this provider before scheduled apt in May.  Patient advised to contact office with any questions, adverse effects, or acute worsening in signs and symptoms.  Molly Boone was seen today for anxiety.  Diagnoses and all orders for this visit:  Recurrent major  depressive disorder, in full remission ([place]) -     vortioxetine HBr (TRINTELLIX) 5 MG TABS tablet; Take 1 tablet (5 mg total) by mouth daily for 7 days. -     vortioxetine HBr (TRINTELLIX) 10 MG TABS tablet; Take 1 tablet (10 mg total) by mouth daily. -     vortioxetine HBr (TRINTELLIX) 10 MG TABS tablet; Take 1 tablet (10 mg total) by mouth daily.  Anxiety state -     vortioxetine HBr (TRINTELLIX) 5 MG TABS tablet; Take 1 tablet (5 mg total) by mouth daily for 7 days. -     vortioxetine HBr (TRINTELLIX) 10 MG TABS tablet; Take 1 tablet (10 mg total) by mouth daily. -     vortioxetine HBr (TRINTELLIX) 10 MG TABS tablet; Take 1 tablet (10 mg total) by mouth daily.  Attention deficit hyperactivity disorder (ADHD), predominantly inattentive type     Please see After Visit Summary for patient specific instructions.  Future Appointments  Date Time Provider [place]  05/28/2022  1:45 PM Thayer Headings, PMHNP [facility] None    No orders of the defined types were placed in this encounter.     -------------------------------

## 2022-02-18 DIAGNOSIS — J0100 Acute maxillary sinusitis, unspecified: Secondary | ICD-10-CM | POA: Diagnosis not present

## 2022-02-19 MED ORDER — AMPHETAMINE-DEXTROAMPHETAMINE 30 MG PO TABS: 30.0000 mg | ORAL_TABLET | Freq: Every day | ORAL | 0 refills | Status: DC

## 2022-02-19 MED ORDER — FLUOXETINE HCL 10 MG PO CAPS: ORAL_CAPSULE | ORAL | 1 refills | Status: DC

## 2022-03-16 ENCOUNTER — Telehealth: Payer: Self-pay

## 2022-03-16 NOTE — Telephone Encounter (Signed)
Prior Authorization submitted and approved for Trintellix 10 mg with [employer] effective through 03/15/2023

## 2022-04-14 ENCOUNTER — Other Ambulatory Visit: Payer: Self-pay | Admitting: Psychiatry

## 2022-04-14 DIAGNOSIS — F3342 Major depressive disorder, recurrent, in full remission: Secondary | ICD-10-CM

## 2022-04-14 DIAGNOSIS — F4310 Post-traumatic stress disorder, unspecified: Secondary | ICD-10-CM

## 2022-04-16 NOTE — Telephone Encounter (Signed)
Is she still taking?

## 2022-04-16 NOTE — Telephone Encounter (Signed)
Script sent for Fluoxetine 20 mg 1-2 capsules daily.

## 2022-04-16 NOTE — Telephone Encounter (Signed)
Received pharmacy RF request for fluoxetine. I called patient because it isn't mentioned in your last note.  She said she is now taking 40 mg. I just wanted to be sure if she should be.

## 2022-05-28 ENCOUNTER — Ambulatory Visit (INDEPENDENT_AMBULATORY_CARE_PROVIDER_SITE_OTHER): Payer: BC Managed Care – PPO | Admitting: Psychiatry

## 2022-05-28 ENCOUNTER — Encounter: Payer: Self-pay | Admitting: Psychiatry

## 2022-05-28 VITALS — BP 127/82 | HR 81

## 2022-05-28 DIAGNOSIS — F900 Attention-deficit hyperactivity disorder, predominantly inattentive type: Secondary | ICD-10-CM

## 2022-05-28 DIAGNOSIS — F3342 Major depressive disorder, recurrent, in full remission: Secondary | ICD-10-CM

## 2022-05-28 DIAGNOSIS — F4310 Post-traumatic stress disorder, unspecified: Secondary | ICD-10-CM | POA: Diagnosis not present

## 2022-05-28 MED ORDER — AMPHETAMINE-DEXTROAMPHETAMINE 30 MG PO TABS: 30.0000 mg | ORAL_TABLET | Freq: Every day | ORAL | 0 refills | Status: DC

## 2022-05-28 MED ORDER — TOPIRAMATE 100 MG PO TABS: 100.0000 mg | ORAL_TABLET | Freq: Every day | ORAL | 1 refills | Status: DC

## 2022-05-28 NOTE — Progress Notes (Signed)
Molly Boone [id number] 1965-11-23 57 y.o.  Subjective:   Patient ID:  Molly Boone is a 57 y.o. (DOB [birth year]) female.  Chief Complaint: No chief complaint on file.   HPI Molly Boone presents to the office today for follow-up of anxiety, depression, and ADHD. She reports that she had a paradoxical effect with Prozac. She reports that she did not shower for a week, ate fast food multiple times a night, and staying in her pajamas. She reports that Trintellix was not effective. She repots that she stopped medication and denies current depression.   Denies depressed mood. She reports that she has been active and productive- "thriving." She reports occasionally feels lonely.  She reports occasional anxiety in the response to family stressors. Energy and motivation have been good. Concentration has been good. She reports that she will occasionally transpose letters while typing. Appetite has been good. Denies SI.   She is estranged from her mother.   Enjoying her garden.   Adderall last filled 05/25/22.  Effexor XR- "felt like my head was detached from my body." Prozac Zoloft Trintellix- Effective, well-tolerated Seroquel     Review of Systems:  Review of Systems  Medications: I have reviewed the patient's current medications.  Current Outpatient Medications  Medication Sig Dispense Refill  . amphetamine-dextroamphetamine (ADDERALL) 30 MG tablet Take 1 tablet by mouth daily. 30 tablet 0  . amphetamine-dextroamphetamine (ADDERALL) 30 MG tablet Take 1 tablet by mouth daily. 30 tablet 0  . amphetamine-dextroamphetamine (ADDERALL) 30 MG tablet Take 1 tablet by mouth daily. 30 tablet 0  . omeprazole (PRILOSEC OTC) 20 MG tablet Take 20 mg by mouth daily.    . phenazopyridine (PYRIDIUM) 100 MG tablet Take 100 mg by mouth 3 (three) times daily as needed for pain.    Marland Kitchen topiramate (TOPAMAX) 100 MG tablet Take 1 tablet (100 mg total) by mouth daily. 90 tablet 1  . naproxen sodium (ALEVE)  220 MG tablet Take 220 mg by mouth as needed.     No current facility-administered medications for this visit.    Medication Side Effects: None  Allergies:  Allergies  Allergen Reactions  . Amoxicillin-Pot Clavulanate     REACTION: GI Upset  . Effexor [Venlafaxine]   . Macrodantin [Nitrofurantoin Macrocrystal]   . Sulfonamide Derivatives     REACTION: GI Upset    Past Medical History:  Diagnosis Date  . Hiatal hernia   . Interstitial cystitis     Past Medical History, Surgical history, Social history, and Family history were reviewed and updated as appropriate.   Please see review of systems for further details on the patient's review from today.   Objective:   Physical Exam:  BP 127/82   Pulse 81   Physical Exam  Lab Review:  No results found for: "NA", "K", "CL", "CO2", "GLUCOSE", "BUN", "CREATININE", "CALCIUM", "PROT", "ALBUMIN", "AST", "ALT", "ALKPHOS", "BILITOT", "GFRNONAA", "GFRAA"  No results found for: "WBC", "RBC", "HGB", "HCT", "PLT", "MCV", "MCH", "MCHC", "RDW", "LYMPHSABS", "MONOABS", "EOSABS", "BASOSABS"  No results found for: "POCLITH", "LITHIUM"   No results found for: "PHENYTOIN", "PHENOBARB", "VALPROATE", "CBMZ"   .res Assessment: Plan:    There are no diagnoses linked to this encounter.   Please see After Visit Summary for patient specific instructions.  No future appointments.  No orders of the defined types were placed in this encounter.   -------------------------------

## 2022-07-12 ENCOUNTER — Other Ambulatory Visit: Payer: Self-pay | Admitting: Psychiatry

## 2022-07-12 DIAGNOSIS — F3342 Major depressive disorder, recurrent, in full remission: Secondary | ICD-10-CM

## 2022-07-12 DIAGNOSIS — F4310 Post-traumatic stress disorder, unspecified: Secondary | ICD-10-CM

## 2022-07-27 DIAGNOSIS — G43909 Migraine, unspecified, not intractable, without status migrainosus: Secondary | ICD-10-CM | POA: Diagnosis not present

## 2022-09-22 ENCOUNTER — Telehealth: Payer: Self-pay | Admitting: Psychiatry

## 2022-09-22 ENCOUNTER — Other Ambulatory Visit: Payer: Self-pay

## 2022-09-22 DIAGNOSIS — F900 Attention-deficit hyperactivity disorder, predominantly inattentive type: Secondary | ICD-10-CM

## 2022-09-22 MED ORDER — AMPHETAMINE-DEXTROAMPHETAMINE 30 MG PO TABS: 30.0000 mg | ORAL_TABLET | Freq: Every day | ORAL | 0 refills | Status: DC

## 2022-09-22 NOTE — Telephone Encounter (Signed)
Next visit is 11/29/22. Requesting refill on Adderalll called to:  [facility] at [facility], [place], [place]   Phone number is [phone],  Per patient it is in stock at this location.

## 2022-09-22 NOTE — Telephone Encounter (Signed)
Pended.

## 2022-09-23 ENCOUNTER — Other Ambulatory Visit: Payer: Self-pay

## 2022-09-23 DIAGNOSIS — F900 Attention-deficit hyperactivity disorder, predominantly inattentive type: Secondary | ICD-10-CM

## 2022-09-23 MED ORDER — AMPHETAMINE-DEXTROAMPHETAMINE 30 MG PO TABS
30.0000 mg | ORAL_TABLET | Freq: Every day | ORAL | 0 refills | Status: DC
Start: 2022-09-23 — End: 2022-10-25

## 2022-09-23 NOTE — Telephone Encounter (Signed)
Canceled at the previous pharmacy and repended.

## 2022-09-23 NOTE — Telephone Encounter (Signed)
This message indicated the wrong pharmacy to send the Adderall in to. Pt needs this corrected and sent to [facility] on [address] instead. Please send today if possible.

## 2022-10-22 IMAGING — MG MM DIGITAL SCREENING BILAT W/ TOMO AND CAD
7 series · 9 of 23 positions shown · non-contrast
Comparison: Previous exam(s).

CLINICAL DATA: Screening.

EXAM:
DIGITAL SCREENING BILATERAL MAMMOGRAM WITH TOMOSYNTHESIS AND CAD
TECHNIQUE: Bilateral screening digital craniocaudal and mediolateral oblique
mammograms were obtained. Bilateral screening digital breast
tomosynthesis was performed. The images were evaluated with
computer-aided detection.

[L MLO synth-2D]
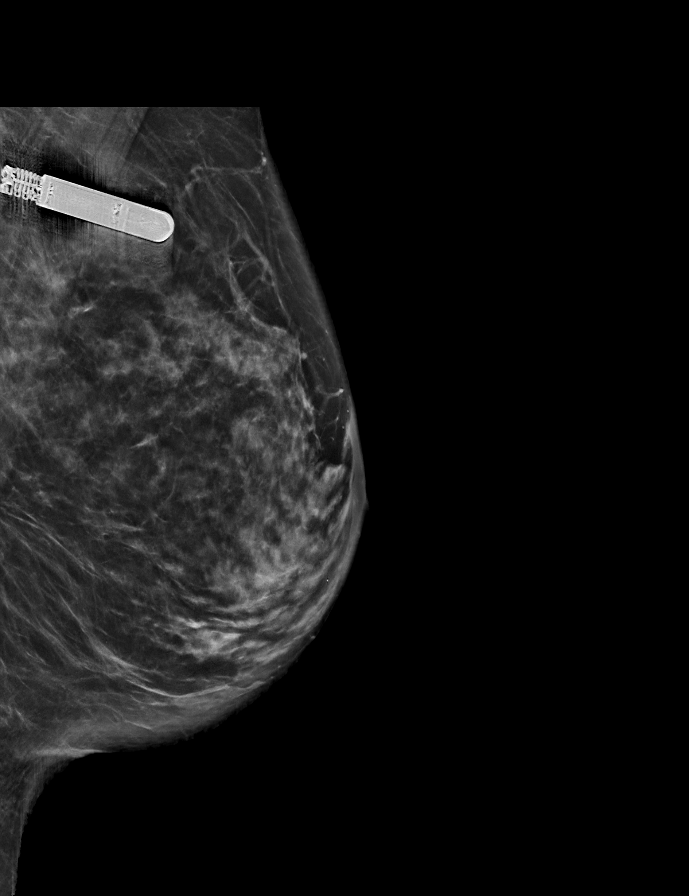

[L CC synth-2D]
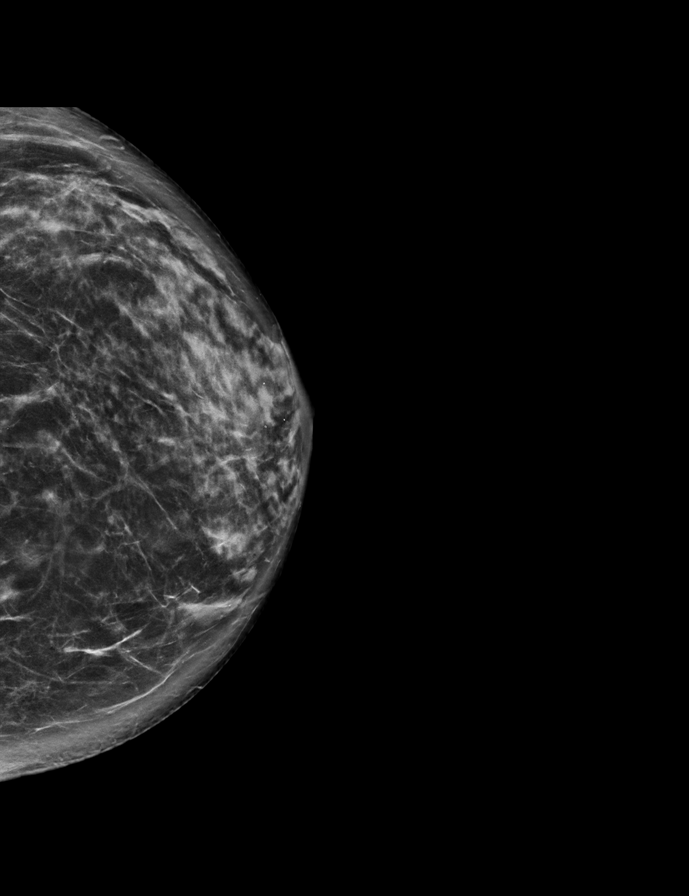

[R MLO synth-2D]
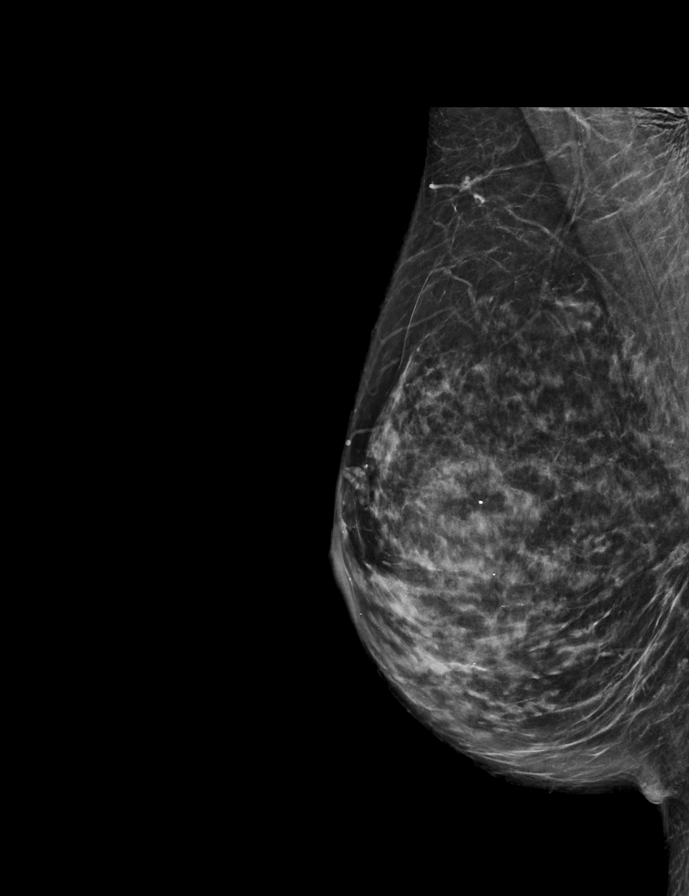

[L CC tomo · 3 of 74 frames shown]
[frame 24/74]
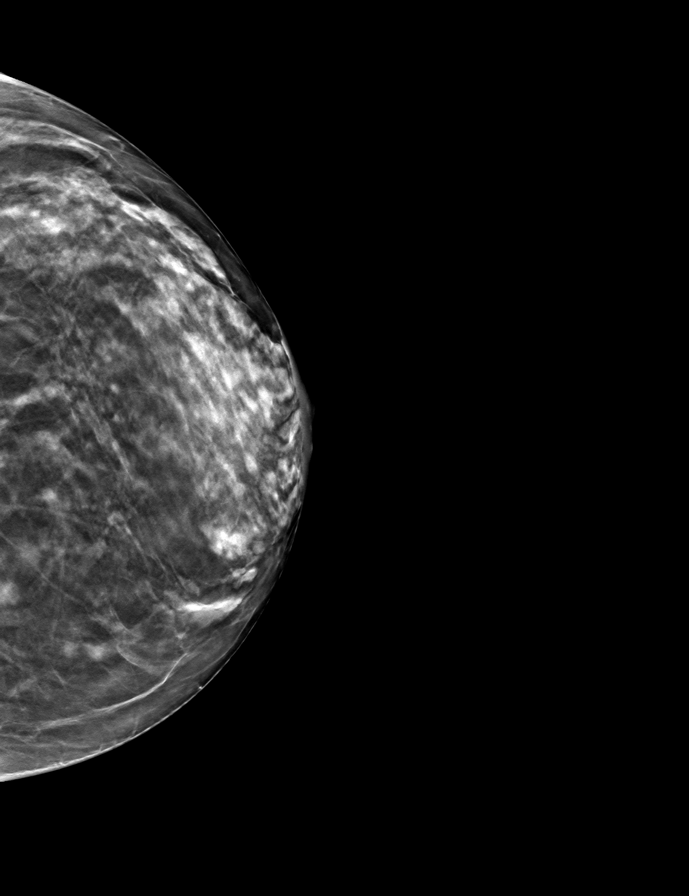
[frame 37/74]
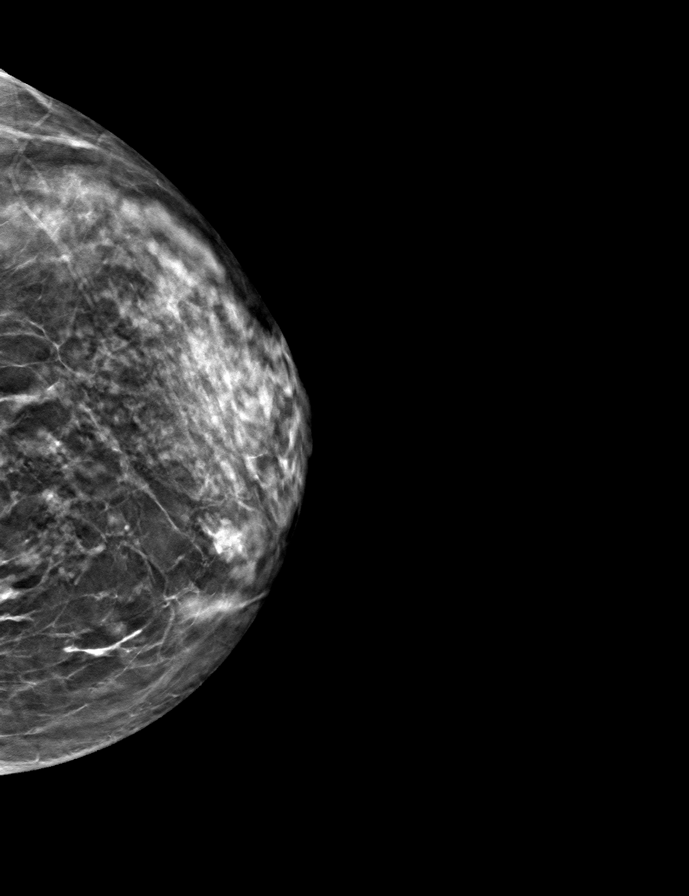
[frame 51/74]
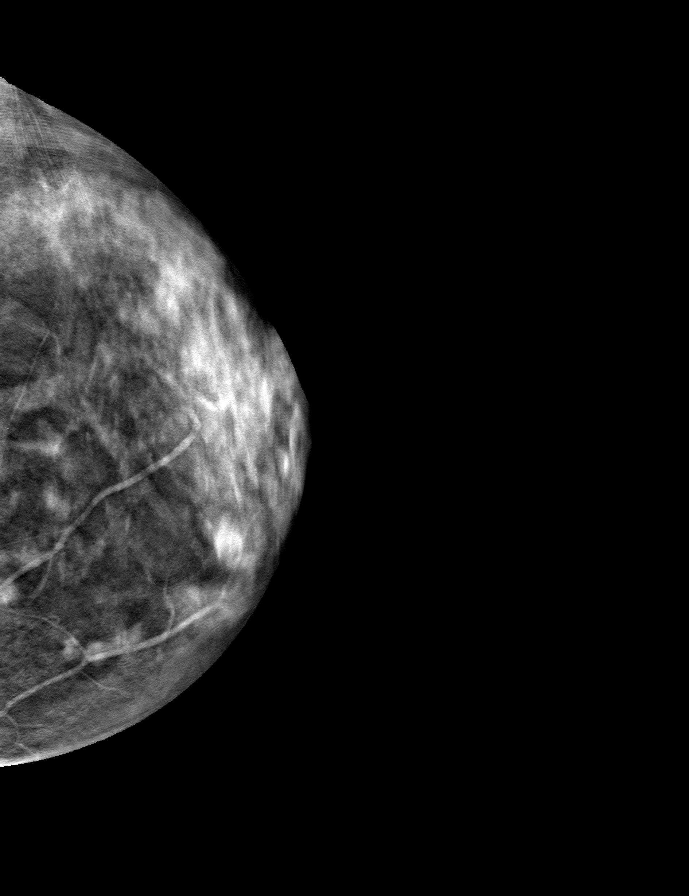

[L MLO tomo · tomo slice 37/72.0]
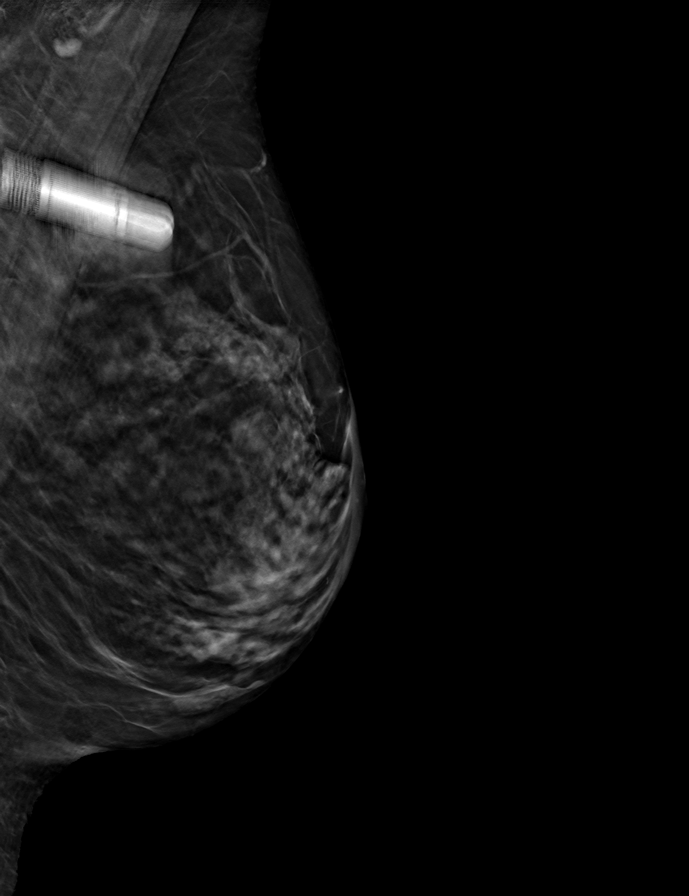

[R CC tomo · tomo slice 35/70.0]
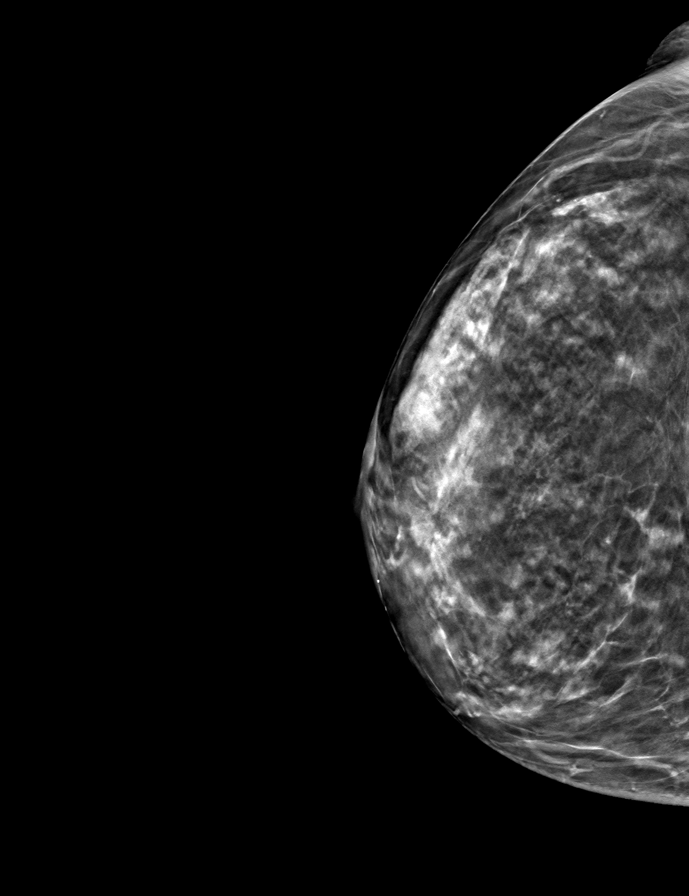

[R MLO tomo · tomo slice 37/72.0]
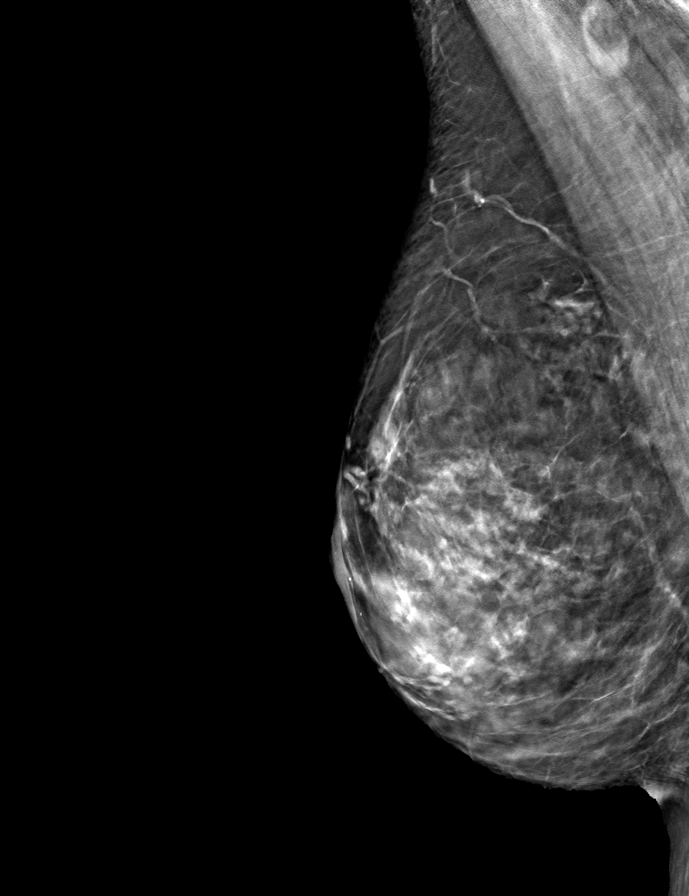

[9 of 23 positions shown; findings below may reference images not displayed]

ACR Breast Density Category c: The breast tissue is heterogeneously
dense, which may obscure small masses.
FINDINGS: There are no findings suspicious for malignancy.
IMPRESSION: No mammographic evidence of malignancy. A result letter of this
screening mammogram will be mailed directly to the patient.

RECOMMENDATION:
Screening mammogram in one year. (Code:Q3-W-BC3)

BI-RADS CATEGORY  1: Negative.

## 2022-10-25 ENCOUNTER — Other Ambulatory Visit: Payer: Self-pay

## 2022-10-25 ENCOUNTER — Telehealth: Payer: Self-pay | Admitting: Psychiatry

## 2022-10-25 DIAGNOSIS — F900 Attention-deficit hyperactivity disorder, predominantly inattentive type: Secondary | ICD-10-CM

## 2022-10-25 MED ORDER — AMPHETAMINE-DEXTROAMPHETAMINE 30 MG PO TABS
30.0000 mg | ORAL_TABLET | Freq: Every day | ORAL | 0 refills | Status: DC
Start: 2022-10-25 — End: 2022-11-29

## 2022-10-25 MED ORDER — AMPHETAMINE-DEXTROAMPHETAMINE 30 MG PO TABS: 30.0000 mg | ORAL_TABLET | Freq: Every day | ORAL | 0 refills | Status: DC

## 2022-10-25 MED ORDER — AMPHETAMINE-DEXTROAMPHETAMINE 30 MG PO TABS
30.0000 mg | ORAL_TABLET | Freq: Every day | ORAL | 0 refills | Status: AC
Start: 2022-12-20 — End: ?

## 2022-10-25 MED ORDER — AMPHETAMINE-DEXTROAMPHETAMINE 30 MG PO TABS
30.0000 mg | ORAL_TABLET | Freq: Every day | ORAL | 0 refills | Status: DC
Start: 2022-11-22 — End: 2022-10-25

## 2022-10-25 NOTE — Telephone Encounter (Signed)
Molly Boone called at 2:05 and she her prescriptions of Adderall were sent to the wrong pharmacy.  She says she did not say [facility] [address].  [facility] said [facility] on [address].  Please transferred the prescriptions to [facility] [id number] [place], [place] - [address]   [address]

## 2022-10-25 NOTE — Telephone Encounter (Signed)
[facility] at [facility] and pended to [facility].

## 2022-10-25 NOTE — Telephone Encounter (Signed)
Pt called and wants 3 scripts [facility] on [address]. She needs her adderall 30 mg

## 2022-10-25 NOTE — Telephone Encounter (Signed)
Pended.

## 2022-11-24 ENCOUNTER — Encounter: Payer: Self-pay | Admitting: Psychiatry

## 2022-11-29 ENCOUNTER — Ambulatory Visit (INDEPENDENT_AMBULATORY_CARE_PROVIDER_SITE_OTHER): Payer: BC Managed Care – PPO | Admitting: Psychiatry

## 2022-11-29 ENCOUNTER — Encounter: Payer: Self-pay | Admitting: Psychiatry

## 2022-11-29 VITALS — BP 115/73 | HR 80 | Wt 156.0 lb

## 2022-11-29 DIAGNOSIS — F900 Attention-deficit hyperactivity disorder, predominantly inattentive type: Secondary | ICD-10-CM

## 2022-11-29 DIAGNOSIS — F3342 Major depressive disorder, recurrent, in full remission: Secondary | ICD-10-CM | POA: Diagnosis not present

## 2022-11-29 DIAGNOSIS — F4310 Post-traumatic stress disorder, unspecified: Secondary | ICD-10-CM | POA: Diagnosis not present

## 2022-11-29 MED ORDER — AMPHETAMINE-DEXTROAMPHETAMINE 30 MG PO TABS
30.0000 mg | ORAL_TABLET | Freq: Every day | ORAL | 0 refills | Status: AC
Start: 2023-02-14 — End: ?

## 2022-11-29 MED ORDER — TOPIRAMATE 100 MG PO TABS: 100.0000 mg | ORAL_TABLET | Freq: Every day | ORAL | 1 refills | Status: AC

## 2022-11-29 MED ORDER — AMPHETAMINE-DEXTROAMPHETAMINE 30 MG PO TABS: 30.0000 mg | ORAL_TABLET | Freq: Every day | ORAL | 0 refills | Status: AC

## 2022-11-29 MED ORDER — AMPHETAMINE-DEXTROAMPHETAMINE 30 MG PO TABS
30.0000 mg | ORAL_TABLET | Freq: Every day | ORAL | 0 refills | Status: AC
Start: 2023-01-17 — End: ?

## 2022-11-29 NOTE — Progress Notes (Unsigned)
Molly Boone [id number] [birth year] 57 y.o.  Subjective:   Patient ID:  Molly Boone is a 57 y.o. (DOB 1965-01-14) female.  Chief Complaint:  Chief Complaint  Patient presents with   ADHD    HPI Molly Boone presents to the office today for follow-up of ADHD and history of anxiety and depression. She reports that she has been busy with work. She reports that her job performance has been recognized. She reports that she functions best with routine. She has been taking care of things that she has been putting off. Mood has been stable. Anxiety has been controlled. Energy and motivation have been good. Difficulty staying asleep. Having early morning awakenings. Appetite has been good. Concentration has been adequate. Denies SI.   Working 10-12 hours daily.   Granddaughter recently turned 71 years old and is "thriving."   Last filled 11/24/22.   Effexor XR- "felt like my head was detached from my body." Prozac Zoloft Trintellix- Effective, well-tolerated Seroquel    Review of Systems:  Review of Systems  HENT:         Getting braces in 2 weeks  Cardiovascular:  Negative for palpitations.  Musculoskeletal:  Negative for gait problem.  Psychiatric/Behavioral:         Please refer to HPI    Medications: I have reviewed the patient's current medications.  Current Outpatient Medications  Medication Sig Dispense Refill   [START ON 03/14/2023] amphetamine-dextroamphetamine (ADDERALL) 30 MG tablet Take 1 tablet by mouth daily. 30 tablet 0   [START ON 12/20/2022] amphetamine-dextroamphetamine (ADDERALL) 30 MG tablet Take 1 tablet by mouth daily. 30 tablet 0   [START ON 02/14/2023] amphetamine-dextroamphetamine (ADDERALL) 30 MG tablet Take 1 tablet by mouth daily. 30 tablet 0   [START ON 01/17/2023] amphetamine-dextroamphetamine (ADDERALL) 30 MG tablet Take 1 tablet by mouth daily. 30 tablet 0   naproxen sodium (ALEVE) 220 MG tablet Take 220 mg by mouth as needed.     omeprazole (PRILOSEC  OTC) 20 MG tablet Take 20 mg by mouth daily.     phenazopyridine (PYRIDIUM) 100 MG tablet Take 100 mg by mouth 3 (three) times daily as needed for pain.     topiramate (TOPAMAX) 100 MG tablet Take 1 tablet (100 mg total) by mouth daily. 90 tablet 1   No current facility-administered medications for this visit.    Medication Side Effects: None  Allergies:  Allergies  Allergen Reactions   Amoxicillin-Pot Clavulanate     REACTION: GI Upset   Effexor [Venlafaxine]    Macrodantin [Nitrofurantoin Macrocrystal]    Sulfonamide Derivatives     REACTION: GI Upset    Past Medical History:  Diagnosis Date   Hiatal hernia    Interstitial cystitis     Past Medical History, Surgical history, Social history, and Family history were reviewed and updated as appropriate.   Please see review of systems for further details on the patient's review from today.   Objective:   Physical Exam:  BP 115/73   Pulse 80   Wt 156 lb (70.8 kg)   BMI 25.96 kg/m   Physical Exam Constitutional:      General: She is not in acute distress. Musculoskeletal:        General: No deformity.  Neurological:     Mental Status: She is alert and oriented to person, place, and time.     Coordination: Coordination normal.  Psychiatric:        Attention and Perception: Attention and perception normal. She does  not perceive auditory or visual hallucinations.        Mood and Affect: Mood normal. Mood is not anxious or depressed. Affect is not labile, blunt, angry or inappropriate.        Speech: Speech normal.        Behavior: Behavior normal.        Thought Content: Thought content normal. Thought content is not paranoid or delusional. Thought content does not include homicidal or suicidal ideation. Thought content does not include homicidal or suicidal plan.        Cognition and Memory: Cognition and memory normal.        Judgment: Judgment normal.     Comments: Insight intact     Lab Review:  No results  found for: "NA", "K", "CL", "CO2", "GLUCOSE", "BUN", "CREATININE", "CALCIUM", "PROT", "ALBUMIN", "AST", "ALT", "ALKPHOS", "BILITOT", "GFRNONAA", "GFRAA"  No results found for: "WBC", "RBC", "HGB", "HCT", "PLT", "MCV", "MCH", "MCHC", "RDW", "LYMPHSABS", "MONOABS", "EOSABS", "BASOSABS"  No results found for: "POCLITH", "LITHIUM"   No results found for: "PHENYTOIN", "PHENOBARB", "VALPROATE", "CBMZ"   .res Assessment: Plan:    30 minutes spent dedicated to the care of this patient on the date of this encounter to include pre-visit review of records, ordering of medication, post visit documentation, and face-to-face time with the patient discussing treatment plan and that provider will be leaving practice. Pt reports that she would like to continue current medications without changes at this time.  Continue Adderall 30 mg daily for ADHD.  Continue Topamax 100 mg daily for mood and anxiety.  Pt to follow-up in 6 months or sooner if clinically indicated.  Requested pt call in 3 months to provide update and request additional scripts.  Patient advised to contact office with any questions, adverse effects, or acute worsening in signs and symptoms.    [name] was seen today for adhd.  Diagnoses and all orders for this visit:  Attention deficit hyperactivity disorder (ADHD), predominantly inattentive type -     amphetamine-dextroamphetamine (ADDERALL) 30 MG tablet; Take 1 tablet by mouth daily. -     amphetamine-dextroamphetamine (ADDERALL) 30 MG tablet; Take 1 tablet by mouth daily. -     amphetamine-dextroamphetamine (ADDERALL) 30 MG tablet; Take 1 tablet by mouth daily.  Recurrent major depressive disorder, in full remission ([facility]) -     topiramate (TOPAMAX) 100 MG tablet; Take 1 tablet (100 mg total) by mouth daily.  PTSD (post-traumatic stress disorder)     Please see After Visit Summary for patient specific instructions.  No future appointments.  No orders of the defined types were  placed in this encounter.   -------------------------------

## 2023-05-03 DIAGNOSIS — F909 Attention-deficit hyperactivity disorder, unspecified type: Secondary | ICD-10-CM | POA: Diagnosis not present

## 2023-05-03 DIAGNOSIS — F3340 Major depressive disorder, recurrent, in remission, unspecified: Secondary | ICD-10-CM | POA: Diagnosis not present

## 2023-07-08 DIAGNOSIS — F3340 Major depressive disorder, recurrent, in remission, unspecified: Secondary | ICD-10-CM | POA: Diagnosis not present

## 2023-07-08 DIAGNOSIS — F909 Attention-deficit hyperactivity disorder, unspecified type: Secondary | ICD-10-CM | POA: Diagnosis not present

## 2023-10-24 DIAGNOSIS — F3340 Major depressive disorder, recurrent, in remission, unspecified: Secondary | ICD-10-CM | POA: Diagnosis not present

## 2023-10-24 DIAGNOSIS — F909 Attention-deficit hyperactivity disorder, unspecified type: Secondary | ICD-10-CM | POA: Diagnosis not present
